# Patient Record
Sex: Female | Born: 1960 | Race: White | Hispanic: No | Marital: Married | Smoking: Never smoker
Health system: Southern US, Community
[De-identification: ages and names within clinical notes are randomized; demographics above are authoritative.]

## PROBLEM LIST (undated history)

## (undated) HISTORY — PX: TONSILLECTOMY: SUR1361

---

## 2017-06-24 DIAGNOSIS — K317 Polyp of stomach and duodenum: Secondary | ICD-10-CM | POA: Insufficient documentation

## 2017-06-24 DIAGNOSIS — K219 Gastro-esophageal reflux disease without esophagitis: Secondary | ICD-10-CM | POA: Insufficient documentation

## 2017-06-24 DIAGNOSIS — Z86010 Personal history of colonic polyps: Secondary | ICD-10-CM | POA: Insufficient documentation

## 2017-07-12 DIAGNOSIS — M174 Other bilateral secondary osteoarthritis of knee: Secondary | ICD-10-CM | POA: Insufficient documentation

## 2017-07-12 DIAGNOSIS — Z6841 Body Mass Index (BMI) 40.0 and over, adult: Secondary | ICD-10-CM | POA: Insufficient documentation

## 2017-09-05 ENCOUNTER — Other Ambulatory Visit: Payer: Self-pay | Admitting: Medical Oncology

## 2017-09-05 DIAGNOSIS — Z1231 Encounter for screening mammogram for malignant neoplasm of breast: Secondary | ICD-10-CM

## 2017-10-01 ENCOUNTER — Ambulatory Visit
Admission: RE | Admit: 2017-10-01 | Discharge: 2017-10-01 | Disposition: A | Discharge status: Home / Self Care | Source: Ambulatory Visit | Attending: Medical Oncology | Admitting: Medical Oncology

## 2017-10-01 ENCOUNTER — Encounter: Payer: Self-pay | Admitting: Radiology

## 2017-10-01 DIAGNOSIS — Z1231 Encounter for screening mammogram for malignant neoplasm of breast: Secondary | ICD-10-CM | POA: Diagnosis not present

## 2018-08-04 ENCOUNTER — Other Ambulatory Visit: Payer: Self-pay | Admitting: Medical Oncology

## 2018-08-04 ENCOUNTER — Ambulatory Visit
Admission: RE | Admit: 2018-08-04 | Discharge: 2018-08-04 | Disposition: A | Discharge status: Home / Self Care | Source: Ambulatory Visit | Attending: Medical Oncology | Admitting: Medical Oncology

## 2018-08-04 DIAGNOSIS — I8312 Varicose veins of left lower extremity with inflammation: Secondary | ICD-10-CM | POA: Diagnosis not present

## 2018-08-04 DIAGNOSIS — I83893 Varicose veins of bilateral lower extremities with other complications: Secondary | ICD-10-CM

## 2018-08-04 DIAGNOSIS — M79605 Pain in left leg: Secondary | ICD-10-CM

## 2018-08-04 DIAGNOSIS — R936 Abnormal findings on diagnostic imaging of limbs: Secondary | ICD-10-CM | POA: Diagnosis not present

## 2018-08-18 ENCOUNTER — Encounter (INDEPENDENT_AMBULATORY_CARE_PROVIDER_SITE_OTHER): Payer: Self-pay | Admitting: Vascular Surgery

## 2018-08-18 ENCOUNTER — Other Ambulatory Visit: Payer: Self-pay | Admitting: Medical Oncology

## 2018-08-18 ENCOUNTER — Ambulatory Visit (INDEPENDENT_AMBULATORY_CARE_PROVIDER_SITE_OTHER): Admitting: Vascular Surgery

## 2018-08-18 VITALS — BP 187/82 | HR 74 | Resp 16 | Ht 65.0 in | Wt 284.4 lb

## 2018-08-18 DIAGNOSIS — R600 Localized edema: Secondary | ICD-10-CM

## 2018-08-18 DIAGNOSIS — M79605 Pain in left leg: Secondary | ICD-10-CM | POA: Diagnosis not present

## 2018-08-18 DIAGNOSIS — E785 Hyperlipidemia, unspecified: Secondary | ICD-10-CM | POA: Diagnosis not present

## 2018-08-18 DIAGNOSIS — M79604 Pain in right leg: Secondary | ICD-10-CM

## 2018-08-18 DIAGNOSIS — Z1231 Encounter for screening mammogram for malignant neoplasm of breast: Secondary | ICD-10-CM

## 2018-08-18 DIAGNOSIS — I10 Essential (primary) hypertension: Secondary | ICD-10-CM

## 2018-08-18 NOTE — Progress Notes (Signed)
Subjective:    Patient ID: Olivia Griffin, female    DOB: 12-06-1960, 57 y.o.   MRN: [id number] Chief Complaint  Patient presents with  . New Patient (Initial Visit)    ref for varicose veins   Presents as a new patient referred by Dr. Basilia Jumbo for evaluation of bilateral lower extremity edema and discomfort.  The patient notes a long-standing history of edema to the bilateral legs.  The patient notes that her edema discomfort worsens towards the end of day or with sitting and standing for long periods of time. The patient's edema is associated with discomfort.  This discomfort worsens with ambulation.  The patient denies any rest pain or ulcer formation to the bilateral lower extremity.  The patient does note that one time in the distant past she did experience weeping from the front of the right calf.  This has not happened again.  At this time, the patient does not engage in conservative therapy including wearing medical grade one compression socks, elevating legs and remaining active.  The patient denies any history of DVT or recent surgery / trauma legs.  The patient denies any recent bouts of recurrent sialitis.  Patient denies any fever, nausea vomiting.  Review of Systems  Constitutional: Negative.   HENT: Negative.   Eyes: Negative.   Respiratory: Negative.   Cardiovascular: Positive for leg swelling.       Lower extremity discomfort  Gastrointestinal: Negative.   Endocrine: Negative.   Genitourinary: Negative.   Musculoskeletal: Negative.   Skin: Negative.   Allergic/Immunologic: Negative.   Neurological: Negative.   Hematological: Negative.   Psychiatric/Behavioral: Negative.       Objective:   Physical Exam  Constitutional: She is oriented to person, place, and time. She appears well-developed and well-nourished. No distress.  HENT:  Head: Normocephalic and atraumatic.  Right Ear: External ear normal.  Left Ear: External ear normal.  Eyes: Pupils are equal, round,  and reactive to light. Conjunctivae and EOM are normal.  Neck: Normal range of motion.  Cardiovascular: Normal rate, regular rhythm, normal heart sounds and intact distal pulses.  Pulses:      Radial pulses are 2+ on the right side, and 2+ on the left side.  To palpate pedal pulses due to body habitus and edema however the bilateral feet are warm.  There is good capillary refill.  Pulmonary/Chest: Effort normal and breath sounds normal.  Musculoskeletal: Normal range of motion. She exhibits edema (Moderately edematous nonpitting edema noted bilaterally).  Neurological: She is alert and oriented to person, place, and time.  Skin: Skin is warm and dry. She is not diaphoretic.  Mild stasis dermatitis noted to the bilateral calves.  Looks like the starting of fibrosis bilaterally.  No cellulitis or active ulcerations noted at this time  Psychiatric: She has a normal mood and affect. Her behavior is normal. Judgment and thought content normal.  Vitals reviewed.  BP (!) 187/82 (BP Location: Right Arm)   Pulse 74   Resp 16   Ht 5\' 5"  (1.651 m)   Wt 284 lb 6.4 oz (129 kg)   BMI 47.33 kg/m   No past medical history on file.  Social History   Socioeconomic History  . Marital status: Married    Spouse name: Not on file  . Number of children: Not on file  . Years of education: Not on file  . Highest education level: Not on file  Occupational History  . Not on file  Social Needs  .  Financial resource strain: Not on file  . Food insecurity:    Worry: Not on file    Inability: Not on file  . Transportation needs:    Medical: Not on file    Non-medical: Not on file  Tobacco Use  . Smoking status: Never Smoker  . Smokeless tobacco: Never Used  Substance and Sexual Activity  . Alcohol use: Yes    Frequency: Never    Comment: ocassionally  . Drug use: Never  . Sexual activity: Not on file  Lifestyle  . Physical activity:    Days per week: Not on file    Minutes per session: Not on  file  . Stress: Not on file  Relationships  . Social connections:    Talks on phone: Not on file    Gets together: Not on file    Attends religious service: Not on file    Active member of club or organization: Not on file    Attends meetings of clubs or organizations: Not on file    Relationship status: Not on file  . Intimate partner violence:    Fear of current or ex partner: Not on file    Emotionally abused: Not on file    Physically abused: Not on file    Forced sexual activity: Not on file  Other Topics Concern  . Not on file  Social History Narrative  . Not on file   Past Surgical History:  Procedure Laterality Date  . CESAREAN SECTION    . TONSILLECTOMY     Family History  Problem Relation Age of Onset  . Breast cancer Mother [id number]  . Breast cancer Paternal Aunt        2 pat aunts   Allergies  Allergen Reactions  . Aspirin Other (See Comments)    Tachycardia- maybe due to excedrin though       Assessment & Plan:  Presents as a new patient referred by Dr. Basilia Jumboovington for evaluation of bilateral lower extremity edema and discomfort.  The patient notes a long-standing history of edema to the bilateral legs.  The patient notes that her edema discomfort worsens towards the end of day or with sitting and standing for long periods of time. The patient's edema is associated with discomfort.  This discomfort worsens with ambulation.  The patient denies any rest pain or ulcer formation to the bilateral lower extremity.  The patient does note that one time in the distant past she did experience weeping from the front of the right calf.  This has not happened again.  At this time, the patient does not engage in conservative therapy including wearing medical grade one compression socks, elevating legs and remaining active.  The patient denies any history of DVT or recent surgery / trauma legs.  The patient denies any recent bouts of recurrent sialitis.  Patient denies any fever, nausea  vomiting.  1. Bilateral lower extremity edema - New The patient presents today with significant edema to the bilateral lower extremity In an attempt to gain control the patient's edema I recommend undergoing 3 layer zinc oxide Unna wraps to the bilateral lower extremity for approximately 1 month changed weekly. When the patient returns in follow-up in 1 month she will undergo an ABI and bilateral lower extremity venous duplex. At that time I will also assess the patient's edema.  If controlled we can transition to compression socks if not we may have to continue Unna wraps The patient was encouraged to elevate her legs  heart level or higher as much as possible I will bring the patient back and have her undergo a bilateral ABI due to the bilateral calf pain she experiences with ambulation.  The patient does have multiple risk factors for peripheral artery disease I will also bring the patient back and have her undergo bilateral lower extremity venous duplex to rule out any venous versus lymphatic disease.  - VAS [place] LOWER EXTREMITY VENOUS REFLUX; Future  2. Lower extremity pain, bilateral - New As above  - VAS [place] ABI WITH/WO TBI; Future  3. Hyperlipidemia, unspecified hyperlipidemia type - Stable Encouraged good control as its slows the progression of atherosclerotic disease  4. Essential hypertension - Stable Encouraged good control as its slows the progression of atherosclerotic disease  Current Outpatient Medications on File Prior to Visit  Medication Sig Dispense Refill  . atorvastatin (LIPITOR) 20 MG tablet     . diclofenac sodium (VOLTAREN) 1 % GEL     . fluticasone (FLONASE) 50 MCG/ACT nasal spray     . irbesartan-hydrochlorothiazide (AVALIDE) 300-12.5 MG tablet Take by mouth.    . loratadine (CLARITIN) 10 MG tablet Take by mouth.    . naproxen (NAPROSYN) 250 MG tablet Take by mouth.    Marland Kitchen omeprazole (PRILOSEC) 20 MG capsule     . Zoster Vaccine Adjuvanted [facility]) injection  Inject IM once. Repeat second injection at reccommended interval     No current facility-administered medications on file prior to visit.    There are no Patient Instructions on file for this visit. No follow-ups on file.  Danyel Tobey A Doniqua Saxby, PA-C

## 2018-08-25 ENCOUNTER — Ambulatory Visit (INDEPENDENT_AMBULATORY_CARE_PROVIDER_SITE_OTHER): Admitting: Nurse Practitioner

## 2018-08-25 ENCOUNTER — Encounter (INDEPENDENT_AMBULATORY_CARE_PROVIDER_SITE_OTHER): Payer: Self-pay

## 2018-08-25 VITALS — BP 145/87 | HR 88 | Resp 18 | Ht 67.0 in | Wt 285.0 lb

## 2018-08-25 DIAGNOSIS — L97929 Non-pressure chronic ulcer of unspecified part of left lower leg with unspecified severity: Secondary | ICD-10-CM

## 2018-08-25 DIAGNOSIS — L97919 Non-pressure chronic ulcer of unspecified part of right lower leg with unspecified severity: Secondary | ICD-10-CM | POA: Diagnosis not present

## 2018-08-25 DIAGNOSIS — I83019 Varicose veins of right lower extremity with ulcer of unspecified site: Secondary | ICD-10-CM

## 2018-08-25 DIAGNOSIS — R600 Localized edema: Secondary | ICD-10-CM

## 2018-08-25 DIAGNOSIS — I83029 Varicose veins of left lower extremity with ulcer of unspecified site: Secondary | ICD-10-CM | POA: Diagnosis not present

## 2018-08-25 NOTE — Progress Notes (Signed)
History of Present Illness  There is no documented history at this time  Assessments & Plan   There are no diagnoses linked to this encounter.    Additional instructions  Subjective:  Patient presents with venous ulcer of the Bilateral lower extremity.    Procedure:  3 layer unna wrap was placed Bilateral lower extremity.   Plan:   Follow up in one week.  

## 2018-08-26 ENCOUNTER — Encounter (INDEPENDENT_AMBULATORY_CARE_PROVIDER_SITE_OTHER): Payer: Self-pay | Admitting: Nurse Practitioner

## 2018-08-27 ENCOUNTER — Other Ambulatory Visit (INDEPENDENT_AMBULATORY_CARE_PROVIDER_SITE_OTHER): Payer: Self-pay | Admitting: Nurse Practitioner

## 2018-08-27 DIAGNOSIS — L97909 Non-pressure chronic ulcer of unspecified part of unspecified lower leg with unspecified severity: Secondary | ICD-10-CM

## 2018-08-27 DIAGNOSIS — I83009 Varicose veins of unspecified lower extremity with ulcer of unspecified site: Secondary | ICD-10-CM | POA: Insufficient documentation

## 2018-09-01 ENCOUNTER — Encounter (INDEPENDENT_AMBULATORY_CARE_PROVIDER_SITE_OTHER)

## 2018-09-03 ENCOUNTER — Ambulatory Visit (INDEPENDENT_AMBULATORY_CARE_PROVIDER_SITE_OTHER): Admitting: Nurse Practitioner

## 2018-09-03 ENCOUNTER — Encounter (INDEPENDENT_AMBULATORY_CARE_PROVIDER_SITE_OTHER): Payer: Self-pay

## 2018-09-03 VITALS — BP 174/94 | HR 69 | Resp 16 | Ht 67.0 in | Wt 285.8 lb

## 2018-09-03 DIAGNOSIS — I83019 Varicose veins of right lower extremity with ulcer of unspecified site: Secondary | ICD-10-CM | POA: Diagnosis not present

## 2018-09-03 DIAGNOSIS — I83029 Varicose veins of left lower extremity with ulcer of unspecified site: Secondary | ICD-10-CM | POA: Diagnosis not present

## 2018-09-03 DIAGNOSIS — L97919 Non-pressure chronic ulcer of unspecified part of right lower leg with unspecified severity: Secondary | ICD-10-CM

## 2018-09-03 DIAGNOSIS — L97929 Non-pressure chronic ulcer of unspecified part of left lower leg with unspecified severity: Secondary | ICD-10-CM | POA: Diagnosis not present

## 2018-09-03 NOTE — Progress Notes (Signed)
History of Present Illness  There is no documented history at this time  Assessments & Plan   There are no diagnoses linked to this encounter.    Additional instructions  Subjective:  Patient presents with venous ulcer of the Bilateral lower extremity.    Procedure:  3 layer unna wrap was placed Bilateral lower extremity.   Plan:   Follow up in one week.  

## 2018-09-04 ENCOUNTER — Encounter (INDEPENDENT_AMBULATORY_CARE_PROVIDER_SITE_OTHER): Payer: Self-pay | Admitting: Nurse Practitioner

## 2018-09-08 ENCOUNTER — Ambulatory Visit (INDEPENDENT_AMBULATORY_CARE_PROVIDER_SITE_OTHER): Admitting: Nurse Practitioner

## 2018-09-08 ENCOUNTER — Encounter (INDEPENDENT_AMBULATORY_CARE_PROVIDER_SITE_OTHER): Payer: Self-pay

## 2018-09-08 VITALS — BP 140/77 | HR 75 | Resp 16

## 2018-09-08 DIAGNOSIS — I83029 Varicose veins of left lower extremity with ulcer of unspecified site: Secondary | ICD-10-CM

## 2018-09-08 DIAGNOSIS — L97929 Non-pressure chronic ulcer of unspecified part of left lower leg with unspecified severity: Secondary | ICD-10-CM

## 2018-09-08 DIAGNOSIS — L97919 Non-pressure chronic ulcer of unspecified part of right lower leg with unspecified severity: Secondary | ICD-10-CM

## 2018-09-08 DIAGNOSIS — I83019 Varicose veins of right lower extremity with ulcer of unspecified site: Secondary | ICD-10-CM

## 2018-09-08 DIAGNOSIS — R600 Localized edema: Secondary | ICD-10-CM

## 2018-09-08 NOTE — Progress Notes (Signed)
History of Present Illness  There is no documented history at this time  Assessments & Plan   There are no diagnoses linked to this encounter.    Additional instructions  Subjective:  Patient presents with venous ulcer of the Bilateral lower extremity.    Procedure:  3 layer unna wrap was placed Bilateral lower extremity.   Plan:   Follow up in one week.  

## 2018-09-15 ENCOUNTER — Encounter (INDEPENDENT_AMBULATORY_CARE_PROVIDER_SITE_OTHER): Payer: Self-pay

## 2018-09-15 ENCOUNTER — Encounter (INDEPENDENT_AMBULATORY_CARE_PROVIDER_SITE_OTHER)

## 2018-09-15 ENCOUNTER — Ambulatory Visit (INDEPENDENT_AMBULATORY_CARE_PROVIDER_SITE_OTHER): Admitting: Vascular Surgery

## 2018-09-18 ENCOUNTER — Encounter (INDEPENDENT_AMBULATORY_CARE_PROVIDER_SITE_OTHER): Payer: Self-pay | Admitting: Vascular Surgery

## 2018-09-18 ENCOUNTER — Ambulatory Visit (INDEPENDENT_AMBULATORY_CARE_PROVIDER_SITE_OTHER)

## 2018-09-18 ENCOUNTER — Ambulatory Visit (INDEPENDENT_AMBULATORY_CARE_PROVIDER_SITE_OTHER): Admitting: Vascular Surgery

## 2018-09-18 VITALS — BP 158/75 | HR 80 | Resp 17 | Ht 67.0 in | Wt 285.0 lb

## 2018-09-18 DIAGNOSIS — R600 Localized edema: Secondary | ICD-10-CM | POA: Diagnosis not present

## 2018-09-18 DIAGNOSIS — I872 Venous insufficiency (chronic) (peripheral): Secondary | ICD-10-CM | POA: Diagnosis not present

## 2018-09-18 DIAGNOSIS — M79604 Pain in right leg: Secondary | ICD-10-CM | POA: Diagnosis not present

## 2018-09-18 DIAGNOSIS — M79605 Pain in left leg: Secondary | ICD-10-CM | POA: Diagnosis not present

## 2018-09-18 DIAGNOSIS — I10 Essential (primary) hypertension: Secondary | ICD-10-CM | POA: Diagnosis not present

## 2018-09-18 DIAGNOSIS — E785 Hyperlipidemia, unspecified: Secondary | ICD-10-CM | POA: Diagnosis not present

## 2018-09-21 ENCOUNTER — Encounter (INDEPENDENT_AMBULATORY_CARE_PROVIDER_SITE_OTHER): Payer: Self-pay | Admitting: Vascular Surgery

## 2018-09-21 DIAGNOSIS — I872 Venous insufficiency (chronic) (peripheral): Secondary | ICD-10-CM | POA: Insufficient documentation

## 2018-09-21 NOTE — Progress Notes (Signed)
MRN : [id number]  Olivia Griffin is a 57 y.o. (31-Oct-1961) female who presents with chief complaint of  Chief Complaint  Patient presents with  . Follow-up    ultrasound follow up  .  History of Present Illness:   The patient returns to the office for followup evaluation regarding leg swelling.  The swelling has persisted and the pain associated with swelling continues. There have not been any interval development of a ulcerations or wounds.  Since the previous visit the patient has been wearing graduated compression stockings and has noted little if any improvement in the lymphedema. The patient has been using compression routinely morning until night.  The patient also states elevation during the day and exercise is being done too.   Current Meds  Medication Sig  . atorvastatin (LIPITOR) 20 MG tablet   . diclofenac sodium (VOLTAREN) 1 % GEL   . fluticasone (FLONASE) 50 MCG/ACT nasal spray   . irbesartan-hydrochlorothiazide (AVALIDE) 300-12.5 MG tablet Take by mouth.  . loratadine (CLARITIN) 10 MG tablet Take by mouth.  . naproxen (NAPROSYN) 250 MG tablet Take by mouth.  Marland Kitchen omeprazole (PRILOSEC) 20 MG capsule   . Zoster Vaccine Adjuvanted [facility]) injection Inject IM once. Repeat second injection at reccommended interval    History reviewed. No pertinent past medical history.  Past Surgical History:  Procedure Laterality Date  . CESAREAN SECTION    . TONSILLECTOMY      Social History Social History   Tobacco Use  . Smoking status: Never Smoker  . Smokeless tobacco: Never Used  Substance Use Topics  . Alcohol use: Yes    Frequency: Never    Comment: ocassionally  . Drug use: Never    Family History Family History  Problem Relation Age of Onset  . Breast cancer Mother 6  . Breast cancer Paternal Aunt        2 pat aunts    Allergies  Allergen Reactions  . Aspirin Other (See Comments)    Tachycardia- maybe due to excedrin though   . Zinc Oxide Rash       REVIEW OF SYSTEMS (Negative unless checked)  Constitutional: [] Weight loss  [] Fever  [] Chills Cardiac: [] Chest pain   [] Chest pressure   [] Palpitations   [] Shortness of breath when laying flat   [] Shortness of breath with exertion. Vascular:  [] Pain in legs with walking   [x] Pain in legs at rest  [] History of DVT   [] Phlebitis   [x] Swelling in legs   [] Varicose veins   [] Non-healing ulcers Pulmonary:   [] Uses home oxygen   [] Productive cough   [] Hemoptysis   [] Wheeze  [] COPD   [] Asthma Neurologic:  [] Dizziness   [] Seizures   [] History of stroke   [] History of TIA  [] Aphasia   [] Vissual changes   [] Weakness or numbness in arm   [] Weakness or numbness in leg Musculoskeletal:   [] Joint swelling   [] Joint pain   [] Low back pain Hematologic:  [] Easy bruising  [] Easy bleeding   [] Hypercoagulable state   [] Anemic Gastrointestinal:  [] Diarrhea   [] Vomiting  [] Gastroesophageal reflux/heartburn   [] Difficulty swallowing. Genitourinary:  [] Chronic kidney disease   [] Difficult urination  [] Frequent urination   [] Blood in urine Skin:  [] Rashes   [] Ulcers  Psychological:  [] History of anxiety   []  History of major depression.  Physical Examination  Vitals:   09/18/18 1401  BP: (!) 158/75  Pulse: 80  Resp: 17  Weight: 285 lb (129.3 kg)  Height: 5\' 7"  (1.702 m)  Body mass index is 44.64 kg/m. Gen: WD/WN, NAD Head: [place]/AT, No temporalis wasting.  Ear/Nose/Throat: Hearing grossly intact, nares w/o erythema or drainage Eyes: PER, EOMI, sclera nonicteric.  Neck: Supple, no large masses.   Pulmonary:  Good air movement, no audible wheezing bilaterally, no use of accessory muscles.  Cardiac: RRR, no JVD Vascular: scattered varicosities present bilaterally.  Mild venous stasis changes to the legs bilaterally.  2-3+ soft pitting edema Vessel Right Left  Radial Palpable Palpable  PT Palpable Palpable  DP Palpable Palpable  Gastrointestinal: Non-distended. No guarding/no peritoneal signs.   Musculoskeletal: M/S 5/5 throughout.  No deformity or atrophy.  Neurologic: CN 2-12 intact. Symmetrical.  Speech is fluent. Motor exam as listed above. Psychiatric: Judgment intact, Mood & affect appropriate for pt's clinical situation. Dermatologic: No rashes or ulcers noted.  No changes consistent with cellulitis. Lymph : No lichenification or skin changes of chronic lymphedema.  CBC No results found for: WBC, HGB, HCT, MCV, PLT  BMET No results found for: NA, K, CL, CO2, GLUCOSE, BUN, CREATININE, CALCIUM, GFRNONAA, GFRAA CrCl cannot be calculated (No successful lab value found.).  COAG No results found for: INR, PROTIME  Radiology No results found.   Assessment/Plan 1. Bilateral lower extremity edema Recommend:  No surgery or intervention at this point in time.    I have reviewed my previous discussion with the patient regarding swelling and why it causes symptoms.  Patient will continue wearing graduated compression stockings class 1 (20-30 mmHg) on a daily basis. The patient will  beginning wearing the stockings first thing in the morning and removing them in the evening. The patient is instructed specifically not to sleep in the stockings.    In addition, behavioral modification including several periods of elevation of the lower extremities during the day will be continued.  This was reviewed with the patient during the initial visit.  The patient will also continue routine exercise, especially walking on a daily basis as was discussed during the initial visit.    Despite conservative treatments including graduated compression therapy class 1 and behavioral modification including exercise and elevation the patient  has not obtained adequate control of the lymphedema.  The patient still has stage 3 lymphedema and therefore, I believe that a lymph pump should be added to improve the control of the patient's lymphedema.  Additionally, a lymph pump is warranted because it will  reduce the risk of cellulitis and ulceration in the future.  Patient should follow-up in six months    2. Chronic venous insufficiency Recommend:  No surgery or intervention at this point in time.    I have reviewed my previous discussion with the patient regarding swelling and why it causes symptoms.  Patient will continue wearing graduated compression stockings class 1 (20-30 mmHg) on a daily basis. The patient will  beginning wearing the stockings first thing in the morning and removing them in the evening. The patient is instructed specifically not to sleep in the stockings.    In addition, behavioral modification including several periods of elevation of the lower extremities during the day will be continued.  This was reviewed with the patient during the initial visit.  The patient will also continue routine exercise, especially walking on a daily basis as was discussed during the initial visit.    Despite conservative treatments including graduated compression therapy class 1 and behavioral modification including exercise and elevation the patient  has not obtained adequate control of the lymphedema.  The patient still has  stage 3 lymphedema and therefore, I believe that a lymph pump should be added to improve the control of the patient's lymphedema.  Additionally, a lymph pump is warranted because it will reduce the risk of cellulitis and ulceration in the future.  Patient should follow-up in six months    3. Essential hypertension Continue antihypertensive medications as already ordered, these medications have been reviewed and there are no changes at this time.   4. Hyperlipidemia, unspecified hyperlipidemia type Continue statin as ordered and reviewed, no changes at this time     Levora Dredge, MD  09/21/2018 10:27 AM

## 2018-10-03 DIAGNOSIS — R7309 Other abnormal glucose: Secondary | ICD-10-CM | POA: Insufficient documentation

## 2018-10-06 ENCOUNTER — Ambulatory Visit
Admission: RE | Admit: 2018-10-06 | Discharge: 2018-10-06 | Disposition: A | Discharge status: Home / Self Care | Source: Ambulatory Visit | Attending: Medical Oncology | Admitting: Medical Oncology

## 2018-10-06 DIAGNOSIS — Z1231 Encounter for screening mammogram for malignant neoplasm of breast: Secondary | ICD-10-CM | POA: Insufficient documentation

## 2019-03-14 ENCOUNTER — Emergency Department
Admission: EM | Admit: 2019-03-14 | Discharge: 2019-03-14 | Disposition: A | Discharge status: Home / Self Care | Attending: Emergency Medicine | Admitting: Emergency Medicine

## 2019-03-14 ENCOUNTER — Emergency Department

## 2019-03-14 ENCOUNTER — Encounter: Payer: Self-pay | Admitting: Emergency Medicine

## 2019-03-14 ENCOUNTER — Other Ambulatory Visit: Payer: Self-pay

## 2019-03-14 DIAGNOSIS — Y9389 Activity, other specified: Secondary | ICD-10-CM | POA: Insufficient documentation

## 2019-03-14 DIAGNOSIS — S6991XA Unspecified injury of right wrist, hand and finger(s), initial encounter: Secondary | ICD-10-CM | POA: Diagnosis present

## 2019-03-14 DIAGNOSIS — I10 Essential (primary) hypertension: Secondary | ICD-10-CM | POA: Insufficient documentation

## 2019-03-14 DIAGNOSIS — Z23 Encounter for immunization: Secondary | ICD-10-CM | POA: Insufficient documentation

## 2019-03-14 DIAGNOSIS — Z79899 Other long term (current) drug therapy: Secondary | ICD-10-CM | POA: Diagnosis not present

## 2019-03-14 DIAGNOSIS — S61212A Laceration without foreign body of right middle finger without damage to nail, initial encounter: Secondary | ICD-10-CM | POA: Diagnosis not present

## 2019-03-14 DIAGNOSIS — Y929 Unspecified place or not applicable: Secondary | ICD-10-CM | POA: Diagnosis not present

## 2019-03-14 DIAGNOSIS — S61411A Laceration without foreign body of right hand, initial encounter: Secondary | ICD-10-CM | POA: Insufficient documentation

## 2019-03-14 DIAGNOSIS — S62114A Nondisplaced fracture of triquetrum [cuneiform] bone, right wrist, initial encounter for closed fracture: Secondary | ICD-10-CM | POA: Diagnosis not present

## 2019-03-14 DIAGNOSIS — S52571A Other intraarticular fracture of lower end of right radius, initial encounter for closed fracture: Secondary | ICD-10-CM | POA: Insufficient documentation

## 2019-03-14 DIAGNOSIS — Y999 Unspecified external cause status: Secondary | ICD-10-CM | POA: Diagnosis not present

## 2019-03-14 DIAGNOSIS — S20211A Contusion of right front wall of thorax, initial encounter: Secondary | ICD-10-CM | POA: Diagnosis not present

## 2019-03-14 MED ORDER — TETANUS-DIPHTH-ACELL PERTUSSIS 5-2.5-18.5 LF-MCG/0.5 IM SUSP
0.5000 mL | Freq: Once | INTRAMUSCULAR | Status: AC
  Administered 2019-03-14: 0.5 mL via INTRAMUSCULAR
  Filled 2019-03-14: qty 0.5

## 2019-03-14 MED ORDER — ACETAMINOPHEN 500 MG PO TABS
1000.0000 mg | ORAL_TABLET | Freq: Once | ORAL | Status: DC
  Filled 2019-03-14: qty 2

## 2019-03-14 MED ORDER — LIDOCAINE HCL (PF) 1 % IJ SOLN
10.0000 mL | Freq: Once | INTRAMUSCULAR | Status: AC
  Administered 2019-03-14: 10 mL
  Filled 2019-03-14: qty 10

## 2019-03-14 MED ORDER — CEPHALEXIN 500 MG PO CAPS: 1000.0000 mg | ORAL_CAPSULE | Freq: Two times a day (BID) | ORAL | 0 refills | Status: DC

## 2019-03-14 MED ORDER — ONDANSETRON 4 MG PO TBDP
4.0000 mg | ORAL_TABLET | Freq: Once | ORAL | Status: AC
  Administered 2019-03-14: 16:00:00 4 mg via ORAL
  Filled 2019-03-14: qty 1

## 2019-03-14 MED ORDER — OXYCODONE-ACETAMINOPHEN 5-325 MG PO TABS
1.0000 | ORAL_TABLET | Freq: Once | ORAL | Status: AC
  Administered 2019-03-14: 1 via ORAL
  Filled 2019-03-14: qty 1

## 2019-03-14 MED ORDER — OXYCODONE-ACETAMINOPHEN 5-325 MG PO TABS: 1.0000 | ORAL_TABLET | Freq: Four times a day (QID) | ORAL | 0 refills | Status: DC | PRN
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
## 2019-03-23 ENCOUNTER — Ambulatory Visit (INDEPENDENT_AMBULATORY_CARE_PROVIDER_SITE_OTHER): Admitting: Vascular Surgery

## 2019-04-09 ENCOUNTER — Ambulatory Visit (INDEPENDENT_AMBULATORY_CARE_PROVIDER_SITE_OTHER): Admitting: Vascular Surgery

## 2019-04-23 ENCOUNTER — Ambulatory Visit (INDEPENDENT_AMBULATORY_CARE_PROVIDER_SITE_OTHER): Admitting: Vascular Surgery

## 2019-05-07 ENCOUNTER — Other Ambulatory Visit: Payer: Self-pay | Admitting: Medical Oncology

## 2019-05-07 DIAGNOSIS — N6310 Unspecified lump in the right breast, unspecified quadrant: Secondary | ICD-10-CM

## 2019-05-25 ENCOUNTER — Other Ambulatory Visit: Payer: Self-pay | Admitting: Medical Oncology

## 2019-05-25 DIAGNOSIS — N6310 Unspecified lump in the right breast, unspecified quadrant: Secondary | ICD-10-CM

## 2019-05-27 ENCOUNTER — Encounter (INDEPENDENT_AMBULATORY_CARE_PROVIDER_SITE_OTHER): Payer: Self-pay | Admitting: Vascular Surgery

## 2019-10-15 ENCOUNTER — Other Ambulatory Visit: Payer: Self-pay | Admitting: Medical Oncology

## 2019-10-15 DIAGNOSIS — N6310 Unspecified lump in the right breast, unspecified quadrant: Secondary | ICD-10-CM

## 2020-07-29 DIAGNOSIS — Z01818 Encounter for other preprocedural examination: Secondary | ICD-10-CM | POA: Insufficient documentation

## 2020-08-19 ENCOUNTER — Other Ambulatory Visit: Payer: Self-pay | Admitting: Medical Oncology

## 2020-08-19 DIAGNOSIS — Z1231 Encounter for screening mammogram for malignant neoplasm of breast: Secondary | ICD-10-CM

## 2020-08-31 ENCOUNTER — Ambulatory Visit
Admission: RE | Admit: 2020-08-31 | Discharge: 2020-08-31 | Disposition: A | Discharge status: Home / Self Care | Source: Ambulatory Visit | Attending: Medical Oncology | Admitting: Medical Oncology

## 2020-08-31 ENCOUNTER — Other Ambulatory Visit: Payer: Self-pay

## 2020-08-31 DIAGNOSIS — Z1231 Encounter for screening mammogram for malignant neoplasm of breast: Secondary | ICD-10-CM | POA: Diagnosis not present

## 2020-09-05 ENCOUNTER — Encounter (INDEPENDENT_AMBULATORY_CARE_PROVIDER_SITE_OTHER): Payer: Self-pay | Admitting: Vascular Surgery

## 2020-09-05 ENCOUNTER — Ambulatory Visit (INDEPENDENT_AMBULATORY_CARE_PROVIDER_SITE_OTHER): Admitting: Vascular Surgery

## 2020-09-05 ENCOUNTER — Other Ambulatory Visit: Payer: Self-pay

## 2020-09-05 VITALS — BP 137/86 | HR 101 | Resp 16 | Wt 273.6 lb

## 2020-09-05 DIAGNOSIS — I10 Essential (primary) hypertension: Secondary | ICD-10-CM | POA: Diagnosis not present

## 2020-09-05 DIAGNOSIS — E785 Hyperlipidemia, unspecified: Secondary | ICD-10-CM | POA: Diagnosis not present

## 2020-09-05 DIAGNOSIS — I872 Venous insufficiency (chronic) (peripheral): Secondary | ICD-10-CM | POA: Diagnosis not present

## 2020-09-05 DIAGNOSIS — I890 Lymphedema, not elsewhere classified: Secondary | ICD-10-CM | POA: Diagnosis not present

## 2020-09-05 DIAGNOSIS — K219 Gastro-esophageal reflux disease without esophagitis: Secondary | ICD-10-CM

## 2020-09-05 NOTE — Progress Notes (Signed)
MRN : [id number]  Olivia Griffin is a 59 y.o. (09-23-1961) female who presents with chief complaint of No chief complaint on file. Marland Kitchen  History of Present Illness:   The patient returns to the office for followup evaluation regarding leg swelling.  The swelling has persisted but with the lymph pump is much, much better controlled. The pain associated with swelling is essentially eliminated. There have not been any interval development of a ulcerations or wounds.  The patient denies problems with the pump, noting it is working well and the leggings are in good condition.  Since the previous visit the patient has been wearing graduated compression stockings and using the lymph pump on a routine basis and  has noted significant improvement in the lymphedema.   Patient stated the lymph pump has been a very positive factor in her care.    Current Meds  Medication Sig  . acetaminophen (TYLENOL) 325 MG tablet Take by mouth.  Marland Kitchen atorvastatin (LIPITOR) 20 MG tablet   . Blood Glucose Monitoring Suppl (FIFTY50 GLUCOSE METER 2.0) w/Device KIT See admin instructions.  . diclofenac sodium (VOLTAREN) 1 % GEL   . ergocalciferol (VITAMIN D2) 1.25 MG (50000 UT) capsule Take by mouth.  . fluticasone (FLONASE) 50 MCG/ACT nasal spray   . glipiZIDE (GLUCOTROL XL) 5 MG 24 hr tablet Take 1 tablet by mouth daily.  Marland Kitchen glucose blood (PRECISION QID TEST) test strip Use to check fasting blood sugars once daily or if you feel that your sugar is too high/low. E11.9  . irbesartan-hydrochlorothiazide (AVALIDE) 300-12.5 MG tablet Take by mouth.  . Lancets (FREESTYLE) lancets   . loratadine (CLARITIN) 10 MG tablet Take by mouth.  . naproxen (NAPROSYN) 250 MG tablet Take by mouth.  Marland Kitchen omeprazole (PRILOSEC) 20 MG capsule   . topiramate (TOPAMAX) 100 MG tablet Take by mouth 2 (two) times daily.     No past medical history on file.  Past Surgical History:  Procedure Laterality Date  . CESAREAN SECTION    .  TONSILLECTOMY      Social History Social History   Tobacco Use  . Smoking status: Never Smoker  . Smokeless tobacco: Never Used  Substance Use Topics  . Alcohol use: Yes    Comment: ocassionally  . Drug use: Never    Family History Family History  Problem Relation Age of Onset  . Breast cancer Mother 35  . Breast cancer Paternal Aunt        2 pat aunts    Allergies  Allergen Reactions  . Aspirin Other (See Comments)    Tachycardia- maybe due to excedrin though   . Metformin Diarrhea  . Zinc Oxide Rash     REVIEW OF SYSTEMS (Negative unless checked)  Constitutional: $RemoveBeforeDE'[]'pIbeMmVaTdqLYbM$ Weight loss  $Rem'[]'qaqs$ Fever  $Remo'[]'WUTkl$ Chills Cardiac: $RemoveBeforeD'[]'TMxRPZzbsMnPwL$ Chest pain   '[]'$ Chest pressure   '[]'$ Palpitations   '[]'$ Shortness of breath when laying flat   '[]'$ Shortness of breath with exertion. Vascular:  $RemoveBe'[]'flsfziLCb$ Pain in legs with walking   '[]'$ Pain in legs at rest  $Rem'[]'BLVA$ History of DVT   '[]'$ Phlebitis   '[x]'$ Swelling in legs   '[]'$ Varicose veins   '[]'$ Non-healing ulcers Pulmonary:   '[]'$ Uses home oxygen   '[]'$ Productive cough   '[]'$ Hemoptysis   '[]'$ Wheeze  $Remov'[]'VMvGfx$ COPD   '[]'$ Asthma Neurologic:  $RemoveBefo'[]'zfCbHazzJGe$ Dizziness   '[]'$ Seizures   '[]'$ History of stroke   '[]'$ History of TIA  $Re'[]'Idk$ Aphasia   '[]'$ Vissual changes   '[]'$ Weakness or numbness in arm   '[]'$ Weakness or numbness in leg Musculoskeletal:   '[]'$ Joint swelling   '[]'$   Joint pain   [] Low back pain Hematologic:  [] Easy bruising  [] Easy bleeding   [] Hypercoagulable state   [] Anemic Gastrointestinal:  [] Diarrhea   [] Vomiting  [x] Gastroesophageal reflux/heartburn   [] Difficulty swallowing. Genitourinary:  [] Chronic kidney disease   [] Difficult urination  [] Frequent urination   [] Blood in urine Skin:  [] Rashes   [] Ulcers  Psychological:  [] History of anxiety   []  History of major depression.  Physical Examination  Vitals:   09/05/20 1131  BP: 137/86  Pulse: (!) 101  Resp: 16  Weight: 273 lb 9.6 oz (124.1 kg)   Body mass index is 45.53 kg/m. Gen: WD/WN, NAD Head: [place]/AT, No temporalis wasting.  Ear/Nose/Throat: Hearing grossly intact,  nares w/o erythema or drainage Eyes: PER, EOMI, sclera nonicteric.  Neck: Supple, no large masses.   Pulmonary:  Good air movement, no audible wheezing bilaterally, no use of accessory muscles.  Cardiac: RRR, no JVD Vascular: scattered varicosities present bilaterally.  Mild venous stasis changes to the legs bilaterally.  3-4+ soft pitting edema Vessel Right Left  Radial Palpable Palpable  Gastrointestinal: Non-distended. No guarding/no peritoneal signs.  Musculoskeletal: M/S 5/5 throughout.  No deformity or atrophy.  Neurologic: CN 2-12 intact. Symmetrical.  Speech is fluent. Motor exam as listed above. Psychiatric: Judgment intact, Mood & affect appropriate for pt's clinical situation. Dermatologic: Mild venous rashes or ulcers noted.  No changes consistent with cellulitis. Lymph : Mild lichenification with skin changes of chronic lymphedema.  CBC No results found for: WBC, HGB, HCT, MCV, PLT  BMET No results found for: NA, K, CL, CO2, GLUCOSE, BUN, CREATININE, CALCIUM, GFRNONAA, GFRAA CrCl cannot be calculated (No successful lab value found.).  COAG No results found for: INR, PROTIME  Radiology MM 3D SCREEN BREAST BILATERAL  Result Date: 09/01/2020 CLINICAL DATA:  Screening. EXAM: DIGITAL SCREENING BILATERAL MAMMOGRAM WITH TOMO AND CAD COMPARISON:  Previous exam(s). ACR Breast Density Category b: There are scattered areas of fibroglandular density. FINDINGS: There are no findings suspicious for malignancy. Images were processed with CAD. IMPRESSION: No mammographic evidence of malignancy. A result letter of this screening mammogram will be mailed directly to the patient. RECOMMENDATION: Screening mammogram in one year. (Code:SM-B-01Y) BI-RADS CATEGORY  1: Negative. Electronically Signed   By: Dorise Bullion III M.D   On: 09/01/2020 16:13      Assessment/Plan 1. Lymphedema  No surgery or intervention at this point in time.    I have reviewed my discussion with the patient  regarding lymphedema and why it  causes symptoms.  Patient will continue wearing graduated compression stockings class 1 (20-30 mmHg) on a daily basis a prescription was given. The patient is reminded to put the stockings on first thing in the morning and removing them in the evening. The patient is instructed specifically not to sleep in the stockings.   In addition, behavioral modification throughout the day will be continued.  This will include frequent elevation (such as in a recliner), use of over the counter pain medications as needed and exercise such as walking.  I have reviewed systemic causes for chronic edema such as liver, kidney and cardiac etiologies and there does not appear to be any significant changes in these organ systems over the past year.  The patient is under the impression that these organ systems are all stable and unchanged.    The patient will continue aggressive use of the  lymph pump.  This will continue to improve the edema control and prevent sequela such as ulcers and infections.  The patient will follow-up with me on an annual basis.    2. Chronic venous insufficiency  No surgery or intervention at this point in time.    I have reviewed my discussion with the patient regarding lymphedema and why it  causes symptoms.  Patient will continue wearing graduated compression stockings class 1 (20-30 mmHg) on a daily basis a prescription was given. The patient is reminded to put the stockings on first thing in the morning and removing them in the evening. The patient is instructed specifically not to sleep in the stockings.   In addition, behavioral modification throughout the day will be continued.  This will include frequent elevation (such as in a recliner), use of over the counter pain medications as needed and exercise such as walking.  I have reviewed systemic causes for chronic edema such as liver, kidney and cardiac etiologies and there does not appear to be any  significant changes in these organ systems over the past year.  The patient is under the impression that these organ systems are all stable and unchanged.    The patient will continue aggressive use of the  lymph pump.  This will continue to improve the edema control and prevent sequela such as ulcers and infections.   The patient will follow-up with me on an annual basis.    3. Essential hypertension Continue antihypertensive medications as already ordered, these medications have been reviewed and there are no changes at this time.   4. Hyperlipidemia, unspecified hyperlipidemia type Continue statin as ordered and reviewed, no changes at this time   5. Gastroesophageal reflux disease without esophagitis Continue PPI as already ordered, this medication has been reviewed and there are no changes at this time.  Avoidence of caffeine and alcohol  Moderate elevation of the head of the bed    Hortencia Pilar, MD  09/05/2020 11:40 AM

## 2021-08-31 ENCOUNTER — Other Ambulatory Visit: Payer: Self-pay

## 2021-08-31 DIAGNOSIS — Z1231 Encounter for screening mammogram for malignant neoplasm of breast: Secondary | ICD-10-CM

## 2021-09-04 ENCOUNTER — Ambulatory Visit (INDEPENDENT_AMBULATORY_CARE_PROVIDER_SITE_OTHER): Payer: Self-pay | Admitting: Vascular Surgery

## 2021-09-04 ENCOUNTER — Other Ambulatory Visit: Payer: Self-pay

## 2021-09-04 VITALS — BP 168/100 | HR 74 | Ht 64.0 in | Wt 281.0 lb

## 2021-09-04 DIAGNOSIS — I10 Essential (primary) hypertension: Secondary | ICD-10-CM

## 2021-09-04 DIAGNOSIS — K219 Gastro-esophageal reflux disease without esophagitis: Secondary | ICD-10-CM

## 2021-09-04 DIAGNOSIS — I872 Venous insufficiency (chronic) (peripheral): Secondary | ICD-10-CM

## 2021-09-04 DIAGNOSIS — I890 Lymphedema, not elsewhere classified: Secondary | ICD-10-CM

## 2021-09-04 DIAGNOSIS — E785 Hyperlipidemia, unspecified: Secondary | ICD-10-CM

## 2021-09-05 ENCOUNTER — Ambulatory Visit
Admission: RE | Admit: 2021-09-05 | Discharge: 2021-09-05 | Disposition: A | Discharge status: Home / Self Care | Source: Ambulatory Visit

## 2021-09-05 DIAGNOSIS — Z1231 Encounter for screening mammogram for malignant neoplasm of breast: Secondary | ICD-10-CM | POA: Diagnosis not present

## 2021-09-12 ENCOUNTER — Encounter (INDEPENDENT_AMBULATORY_CARE_PROVIDER_SITE_OTHER): Payer: Self-pay | Admitting: Vascular Surgery

## 2021-09-12 NOTE — Progress Notes (Signed)
MRN : [id number]  Olivia Griffin is a 60 y.o. (Sep 16, 1961) female who presents with chief complaint of leg swelling.  History of Present Illness:   The patient returns to the office for followup evaluation regarding leg swelling.  The swelling has improved quite a bit and the pain associated with swelling has decreased substantially. There have not been any interval development of a ulcerations or wounds.  Since the previous visit the patient has been wearing graduated compression stockings and has noted little significant improvement in the lymphedema. The patient has been using compression routinely morning until night.  The patient also states elevation during the day and exercise is being done too.     Current Meds  Medication Sig   acetaminophen (TYLENOL) 325 MG tablet Take by mouth.   atorvastatin (LIPITOR) 20 MG tablet    Blood Glucose Monitoring Suppl (FIFTY50 GLUCOSE METER 2.0) w/Device KIT See admin instructions.   cephALEXin (KEFLEX) 500 MG capsule Take 2 capsules (1,000 mg total) by mouth 2 (two) times daily.   diclofenac sodium (VOLTAREN) 1 % GEL    fluticasone (FLONASE) 50 MCG/ACT nasal spray    glipiZIDE (GLUCOTROL XL) 5 MG 24 hr tablet Take 1 tablet by mouth daily.   glucose blood (PRECISION QID TEST) test strip Use to check fasting blood sugars once daily or if you feel that your sugar is too high/low. E11.9   Lancets (FREESTYLE) lancets    naproxen (NAPROSYN) 250 MG tablet Take by mouth.   omeprazole (PRILOSEC) 20 MG capsule    oxyCODONE-acetaminophen (PERCOCET/ROXICET) 5-325 MG tablet Take 1 tablet by mouth every 6 (six) hours as needed for severe pain.   Zoster Vaccine Adjuvanted [facility]) injection Inject IM once. Repeat second injection at reccommended interval    No past medical history on file.  Past Surgical History:  Procedure Laterality Date   CESAREAN SECTION     TONSILLECTOMY      Social History Social History   Tobacco Use   Smoking  status: Never   Smokeless tobacco: Never  Substance Use Topics   Alcohol use: Yes    Comment: ocassionally   Drug use: Never    Family History Family History  Problem Relation Age of Onset   Breast cancer Mother 42   Breast cancer Paternal Aunt        2 pat aunts    Allergies  Allergen Reactions   Aspirin Other (See Comments)    Tachycardia- maybe due to excedrin though    Metformin Diarrhea   Zinc Oxide Rash     REVIEW OF SYSTEMS (Negative unless checked)  Constitutional: _0 Weight loss  _1 Fever  _2 Chills Cardiac: _3 Chest pain   _4 Chest pressure   _5 Palpitations   _6 Shortness of breath when laying flat   _7 Shortness of breath with exertion. Vascular:  _8 Pain in legs with walking   _9 Pain in legs at rest  _10 History of DVT   _11 Phlebitis   _12 Swelling in legs   _13 Varicose veins   _14 Non-healing ulcers Pulmonary:   _15 Uses home oxygen   _16 Productive cough   _17 Hemoptysis   _18 Wheeze  _19 COPD   _20 Asthma Neurologic:  _21 Dizziness   _22 Seizures   _23 History of stroke   _24 History of TIA  _25 Aphasia   _26 Vissual changes   _27 Weakness or numbness in arm   _28 Weakness or numbness in leg Musculoskeletal:   _29 Joint swelling   _30 Joint pain   _31 Low back pain Hematologic:  _32 Easy bruising  _33 Easy bleeding   _34 Hypercoagulable state   _35 Anemic Gastrointestinal:  _36   Diarrhea   _0 Vomiting  _1 Gastroesophageal reflux/heartburn   _2 Difficulty swallowing. Genitourinary:  _3 Chronic kidney disease   _4 Difficult urination  _5 Frequent urination   _6 Blood in urine Skin:  _7 Rashes   _8 Ulcers  Psychological:  _9 History of anxiety   _10  History of major depression.  Physical Examination  Vitals:   09/04/21 1531  BP: (!) 168/100  Pulse: 74  Weight: 281 lb (127.5 kg)  Height: _11  (1.626 m)   Body mass index is 48.23 kg/m. Gen: WD/WN, NAD Head: [place]/AT, No temporalis wasting.  Ear/Nose/Throat: Hearing grossly intact, nares w/o erythema or drainage, pinna without lesions Eyes: PER, EOMI, sclera nonicteric.   Neck: Supple, no gross masses.  No JVD.  Pulmonary:  Good air movement, no audible wheezing, no use of accessory muscles.  Cardiac: RRR, precordium not hyperdynamic. Vascular:  scattered varicosities present bilaterally.  Mild venous stasis changes to the legs bilaterally.  2+ soft pitting edema  Vessel Right Left  Radial Palpable Palpable  Gastrointestinal: soft, non-distended. No guarding/no peritoneal signs.  Musculoskeletal: M/S 5/5 throughout.  No deformity.  Neurologic: CN 2-12 intact. Pain and light touch intact in extremities.  Symmetrical.  Speech is fluent. Motor exam as listed above. Psychiatric: Judgment intact, Mood & affect appropriate for pt's clinical situation. Dermatologic: Venous rashes no ulcers noted.  No changes consistent with cellulitis. Lymph : No lichenification or skin changes of chronic lymphedema.  CBC No results found for: WBC, HGB, HCT, MCV, PLT  BMET No results found for: NA, K, CL, CO2, GLUCOSE, BUN, CREATININE, CALCIUM, GFRNONAA, GFRAA CrCl cannot be calculated (No successful lab value found.).  COAG No results found for: INR, PROTIME  Radiology MM 3D SCREEN BREAST BILATERAL  Result Date: 09/08/2021 CLINICAL DATA:  Screening. EXAM: DIGITAL SCREENING BILATERAL MAMMOGRAM WITH TOMOSYNTHESIS AND CAD TECHNIQUE: Bilateral screening digital craniocaudal and mediolateral oblique mammograms were obtained. Bilateral screening digital breast tomosynthesis was performed. The images were evaluated with computer-aided detection. COMPARISON:  Previous exam(s). ACR Breast Density Category a: The breast tissue is almost entirely fatty. FINDINGS: There are no findings suspicious for malignancy. IMPRESSION: No mammographic evidence of malignancy. A result letter of this screening mammogram will be mailed directly to the patient. RECOMMENDATION: Screening mammogram in one year. (Code:SM-B-01Y) BI-RADS CATEGORY  1: Negative. Electronically Signed   By: Evangeline Dakin M.D.    On: 09/08/2021 11:42    Assessment/Plan 1. Lymphedema No surgery or intervention at this point in time.  I have reviewed my discussion with the patient regarding venous insufficiency and why it causes symptoms. I have discussed with the patient the chronic skin changes that accompany venous insufficiency and the long term sequela such as ulceration. Patient will contnue wearing graduated compression stockings on a daily basis, as this has provided excellent control of his edema. The patient will put the stockings on first thing in the morning and removing them in the evening. The patient is reminded not to sleep in the stockings.  In addition, behavioral modification including elevation during the day will be initiated. Exercise is strongly encouraged.  Given the patient's good control and lack of any problems regarding the venous insufficiency and lymphedema and the lymph pump is working.  The patient will follow up with me PRN should anything change.  The patient voices agreement with this plan.   2. Chronic venous insufficiency No surgery or intervention at this point in time.  I have reviewed my discussion with the patient regarding venous insufficiency and why it causes symptoms. I have discussed  with the patient the chronic skin changes that accompany venous insufficiency and the long term sequela such as ulceration. Patient will contnue wearing graduated compression stockings on a daily basis, as this has provided excellent control of his edema. The patient will put the stockings on first thing in the morning and removing them in the evening. The patient is reminded not to sleep in the stockings.  In addition, behavioral modification including elevation during the day will be initiated. Exercise is strongly encouraged.  Given the patient's good control and lack of any problems regarding the venous insufficiency and lymphedema and the lymph pump is working.  The patient will follow up  with me PRN should anything change.  The patient voices agreement with this plan.   3. Essential hypertension Continue antihypertensive medications as already ordered, these medications have been reviewed and there are no changes at this time.   4. Hyperlipidemia, unspecified hyperlipidemia type Continue statin as ordered and reviewed, no changes at this time   5. Gastroesophageal reflux disease without esophagitis Continue PPI as already ordered, this medication has been reviewed and there are no changes at this time.  Avoidence of caffeine and alcohol  Moderate elevation of the head of the bed      Hortencia Pilar, MD  09/12/2021 4:07 PM

## 2022-02-01 ENCOUNTER — Other Ambulatory Visit: Payer: Self-pay | Admitting: Surgery

## 2022-02-01 DIAGNOSIS — M1711 Unilateral primary osteoarthritis, right knee: Secondary | ICD-10-CM

## 2022-02-07 ENCOUNTER — Ambulatory Visit
Admission: RE | Admit: 2022-02-07 | Discharge: 2022-02-07 | Disposition: A | Discharge status: Home / Self Care | Source: Ambulatory Visit | Attending: Surgery | Admitting: Surgery

## 2022-02-07 ENCOUNTER — Other Ambulatory Visit: Payer: Self-pay

## 2022-02-07 DIAGNOSIS — M1711 Unilateral primary osteoarthritis, right knee: Secondary | ICD-10-CM | POA: Insufficient documentation

## 2022-02-20 ENCOUNTER — Encounter: Payer: Self-pay | Admitting: Cardiology

## 2022-02-20 ENCOUNTER — Other Ambulatory Visit: Payer: Self-pay

## 2022-02-20 ENCOUNTER — Encounter
Admission: RE | Disposition: A | Discharge status: Home / Self Care | Payer: Self-pay | Source: Home / Self Care | Attending: Cardiology

## 2022-02-20 ENCOUNTER — Observation Stay
Admission: RE | Admit: 2022-02-20 | Discharge: 2022-02-21 | Disposition: A | Discharge status: Home / Self Care | Attending: Cardiology | Admitting: Cardiology

## 2022-02-20 DIAGNOSIS — Z794 Long term (current) use of insulin: Secondary | ICD-10-CM | POA: Insufficient documentation

## 2022-02-20 DIAGNOSIS — E119 Type 2 diabetes mellitus without complications: Secondary | ICD-10-CM | POA: Insufficient documentation

## 2022-02-20 DIAGNOSIS — I10 Essential (primary) hypertension: Secondary | ICD-10-CM | POA: Diagnosis not present

## 2022-02-20 DIAGNOSIS — R931 Abnormal findings on diagnostic imaging of heart and coronary circulation: Secondary | ICD-10-CM | POA: Diagnosis present

## 2022-02-20 DIAGNOSIS — Z79899 Other long term (current) drug therapy: Secondary | ICD-10-CM | POA: Insufficient documentation

## 2022-02-20 DIAGNOSIS — I2510 Atherosclerotic heart disease of native coronary artery without angina pectoris: Principal | ICD-10-CM | POA: Diagnosis present

## 2022-02-20 HISTORY — PX: CORONARY STENT INTERVENTION: CATH118234

## 2022-02-20 HISTORY — PX: LEFT HEART CATH AND CORONARY ANGIOGRAPHY: CATH118249

## 2022-02-20 LAB — POCT ACTIVATED CLOTTING TIME
Activated Clotting Time: 251 seconds
Activated Clotting Time: 329 seconds

## 2022-02-20 LAB — GLUCOSE, CAPILLARY
Glucose-Capillary: 123 mg/dL — ABNORMAL HIGH (ref 70–99)
Glucose-Capillary: 100 mg/dL — ABNORMAL HIGH (ref 70–99)
Glucose-Capillary: 146 mg/dL — ABNORMAL HIGH (ref 70–99)
Glucose-Capillary: 105 mg/dL — ABNORMAL HIGH (ref 70–99)

## 2022-02-20 SURGERY — LEFT HEART CATH AND CORONARY ANGIOGRAPHY
Anesthesia: Moderate Sedation

## 2022-02-20 MED ORDER — FENTANYL CITRATE (PF) 100 MCG/2ML IJ SOLN
INTRAMUSCULAR | Status: AC
  Filled 2022-02-20: qty 2

## 2022-02-20 MED ORDER — HEPARIN SODIUM (PORCINE) 1000 UNIT/ML IJ SOLN
INTRAMUSCULAR | Status: AC
  Filled 2022-02-20: qty 10

## 2022-02-20 MED ORDER — TICAGRELOR 90 MG PO TABS
ORAL_TABLET | ORAL | Status: AC
  Filled 2022-02-20: qty 2

## 2022-02-20 MED ORDER — HYDROCHLOROTHIAZIDE 12.5 MG PO TABS
12.5000 mg | ORAL_TABLET | Freq: Every day | ORAL | Status: DC
  Filled 2022-02-20 (×2): qty 1

## 2022-02-20 MED ORDER — SODIUM CHLORIDE 0.9% FLUSH: 3.0000 mL | INTRAVENOUS | Status: DC | PRN

## 2022-02-20 MED ORDER — LIDOCAINE HCL (PF) 1 % IJ SOLN
INTRAMUSCULAR | Status: DC | PRN
  Administered 2022-02-20: 5 mL

## 2022-02-20 MED ORDER — ASPIRIN 81 MG PO CHEW
CHEWABLE_TABLET | ORAL | Status: DC | PRN
  Administered 2022-02-20: 243 mg via ORAL

## 2022-02-20 MED ORDER — TOPIRAMATE 25 MG PO TABS
50.0000 mg | ORAL_TABLET | Freq: Every day | ORAL | Status: DC
Start: 2022-02-20 — End: 2022-02-20

## 2022-02-20 MED ORDER — TICAGRELOR 90 MG PO TABS
90.0000 mg | ORAL_TABLET | Freq: Two times a day (BID) | ORAL | Status: DC
  Administered 2022-02-20 – 2022-02-21 (×2): 90 mg via ORAL
  Filled 2022-02-20 (×2): qty 1

## 2022-02-20 MED ORDER — HYDRALAZINE HCL 20 MG/ML IJ SOLN: 10.0000 mg | INTRAMUSCULAR | Status: AC | PRN

## 2022-02-20 MED ORDER — FLUTICASONE PROPIONATE 50 MCG/ACT NA SUSP: 1.0000 | Freq: Every day | NASAL | Status: DC | PRN

## 2022-02-20 MED ORDER — HEPARIN (PORCINE) IN NACL 1000-0.9 UT/500ML-% IV SOLN
INTRAVENOUS | Status: DC | PRN
  Administered 2022-02-20: 1000 mL

## 2022-02-20 MED ORDER — ASPIRIN 81 MG PO CHEW
CHEWABLE_TABLET | ORAL | Status: AC
  Administered 2022-02-20: 81 mg
  Filled 2022-02-20: qty 1

## 2022-02-20 MED ORDER — SODIUM CHLORIDE 0.9 % WEIGHT BASED INFUSION: 1.0000 mL/kg/h | INTRAVENOUS | Status: AC

## 2022-02-20 MED ORDER — ASPIRIN 81 MG PO CHEW
CHEWABLE_TABLET | ORAL | Status: AC
  Filled 2022-02-20: qty 3

## 2022-02-20 MED ORDER — ONDANSETRON HCL 4 MG/2ML IJ SOLN: 4.0000 mg | Freq: Four times a day (QID) | INTRAMUSCULAR | Status: DC | PRN

## 2022-02-20 MED ORDER — IOHEXOL 300 MG/ML  SOLN
INTRAMUSCULAR | Status: DC | PRN
Start: 2022-02-20 — End: 2022-02-20
  Administered 2022-02-20: 254 mL

## 2022-02-20 MED ORDER — HEPARIN (PORCINE) IN NACL 1000-0.9 UT/500ML-% IV SOLN
INTRAVENOUS | Status: AC
  Filled 2022-02-20: qty 1000

## 2022-02-20 MED ORDER — SODIUM CHLORIDE 0.9 % WEIGHT BASED INFUSION
3.0000 mL/kg/h | INTRAVENOUS | Status: AC
  Administered 2022-02-20: 3 mL/kg/h via INTRAVENOUS

## 2022-02-20 MED ORDER — SODIUM CHLORIDE 0.9 % IV SOLN: 250.0000 mL | INTRAVENOUS | Status: DC | PRN

## 2022-02-20 MED ORDER — NITROGLYCERIN 1 MG/10 ML FOR IR/CATH LAB
INTRA_ARTERIAL | Status: DC | PRN
Start: 2022-02-20 — End: 2022-02-20
  Administered 2022-02-20 (×4): 200 ug via INTRACORONARY

## 2022-02-20 MED ORDER — IRBESARTAN 150 MG PO TABS
300.0000 mg | ORAL_TABLET | Freq: Every day | ORAL | Status: DC
Start: 2022-02-20 — End: 2022-02-21
  Administered 2022-02-20 – 2022-02-21 (×2): 300 mg via ORAL
  Filled 2022-02-20 (×2): qty 2

## 2022-02-20 MED ORDER — PANTOPRAZOLE SODIUM 40 MG PO TBEC
40.0000 mg | DELAYED_RELEASE_TABLET | Freq: Every day | ORAL | Status: DC
  Administered 2022-02-20: 40 mg via ORAL
  Filled 2022-02-20 (×2): qty 1

## 2022-02-20 MED ORDER — ASPIRIN 81 MG PO CHEW
81.0000 mg | CHEWABLE_TABLET | Freq: Every day | ORAL | Status: DC
  Administered 2022-02-21: 81 mg via ORAL
  Filled 2022-02-20: qty 1

## 2022-02-20 MED ORDER — MIDAZOLAM HCL 2 MG/2ML IJ SOLN
INTRAMUSCULAR | Status: AC
  Filled 2022-02-20: qty 2

## 2022-02-20 MED ORDER — VERAPAMIL HCL 2.5 MG/ML IV SOLN
INTRAVENOUS | Status: AC
  Filled 2022-02-20: qty 2

## 2022-02-20 MED ORDER — TOPIRAMATE 100 MG PO TABS
100.0000 mg | ORAL_TABLET | Freq: Every evening | ORAL | Status: DC
Start: 2022-02-20 — End: 2022-02-20

## 2022-02-20 MED ORDER — SODIUM CHLORIDE 0.9 % WEIGHT BASED INFUSION
1.0000 mL/kg/h | INTRAVENOUS | Status: DC
Start: 2022-02-20 — End: 2022-02-20

## 2022-02-20 MED ORDER — SODIUM CHLORIDE 0.9% FLUSH
3.0000 mL | Freq: Two times a day (BID) | INTRAVENOUS | Status: DC
  Administered 2022-02-20: 3 mL via INTRAVENOUS

## 2022-02-20 MED ORDER — SODIUM CHLORIDE 0.9% FLUSH
3.0000 mL | Freq: Two times a day (BID) | INTRAVENOUS | Status: DC
  Administered 2022-02-20 – 2022-02-21 (×2): 3 mL via INTRAVENOUS

## 2022-02-20 MED ORDER — LORATADINE 10 MG PO TABS
10.0000 mg | ORAL_TABLET | Freq: Every day | ORAL | Status: DC
  Administered 2022-02-20: 10 mg via ORAL
  Filled 2022-02-20 (×2): qty 1

## 2022-02-20 MED ORDER — HEPARIN SODIUM (PORCINE) 1000 UNIT/ML IJ SOLN
INTRAMUSCULAR | Status: DC | PRN
  Administered 2022-02-20: 7000 [IU] via INTRAVENOUS
  Administered 2022-02-20: 5000 [IU] via INTRAVENOUS
  Administered 2022-02-20: 3000 [IU] via INTRAVENOUS

## 2022-02-20 MED ORDER — SODIUM CHLORIDE 0.9 % IV SOLN
250.0000 mL | INTRAVENOUS | Status: DC | PRN
Start: 2022-02-20 — End: 2022-02-20

## 2022-02-20 MED ORDER — MIDAZOLAM HCL 2 MG/2ML IJ SOLN
INTRAMUSCULAR | Status: DC | PRN
  Administered 2022-02-20: 1 mg via INTRAVENOUS

## 2022-02-20 MED ORDER — VERAPAMIL HCL 2.5 MG/ML IV SOLN
INTRAVENOUS | Status: DC | PRN
Start: 2022-02-20 — End: 2022-02-20
  Administered 2022-02-20: 2.5 mg via INTRA_ARTERIAL

## 2022-02-20 MED ORDER — ATORVASTATIN CALCIUM 20 MG PO TABS
40.0000 mg | ORAL_TABLET | Freq: Every evening | ORAL | Status: DC
  Administered 2022-02-20: 40 mg via ORAL
  Filled 2022-02-20: qty 2

## 2022-02-20 MED ORDER — TICAGRELOR 90 MG PO TABS
ORAL_TABLET | ORAL | Status: DC | PRN
Start: 2022-02-20 — End: 2022-02-20
  Administered 2022-02-20: 180 mg via ORAL

## 2022-02-20 MED ORDER — LIDOCAINE HCL 1 % IJ SOLN
INTRAMUSCULAR | Status: AC
  Filled 2022-02-20: qty 20

## 2022-02-20 MED ORDER — LABETALOL HCL 5 MG/ML IV SOLN: 10.0000 mg | INTRAVENOUS | Status: AC | PRN

## 2022-02-20 MED ORDER — FENTANYL CITRATE (PF) 100 MCG/2ML IJ SOLN
INTRAMUSCULAR | Status: DC | PRN
  Administered 2022-02-20: 50 ug via INTRAVENOUS

## 2022-02-20 SURGICAL SUPPLY — 20 items
BALLN TREK RX 2.25X15 (BALLOONS) ×3
BALLOON TREK RX 2.25X15 (BALLOONS) IMPLANT
CATH 5F 110X4 TIG (CATHETERS) ×1 IMPLANT
CATH INFINITI 5FR ANG PIGTAIL (CATHETERS) ×1 IMPLANT
CATH VISTA GUIDE 6FR JL3.5 (CATHETERS) ×1 IMPLANT
CATH VISTA GUIDE 6FR XB3.5 (CATHETERS) ×1 IMPLANT
DEVICE RAD COMP TR BAND LRG (VASCULAR PRODUCTS) ×1 IMPLANT
DRAPE BRACHIAL (DRAPES) ×1 IMPLANT
GLIDESHEATH SLEND SS 6F .021 (SHEATH) ×1 IMPLANT
GUIDEWIRE INQWIRE 1.5J.035X260 (WIRE) IMPLANT
INQWIRE 1.5J .035X260CM (WIRE) ×6
KIT ENCORE 26 ADVANTAGE (KITS) ×1 IMPLANT
PACK CARDIAC CATH (CUSTOM PROCEDURE TRAY) ×3 IMPLANT
PANNUS RETENTION SYSTEM 2 PAD (MISCELLANEOUS) ×1 IMPLANT
PROTECTION STATION PRESSURIZED (MISCELLANEOUS) ×3
SET ATX SIMPLICITY (MISCELLANEOUS) ×1 IMPLANT
STATION PROTECTION PRESSURIZED (MISCELLANEOUS) IMPLANT
STENT ONYX FRONTIER 2.5X22 (Permanent Stent) ×1 IMPLANT
TUBING CIL FLEX 10 FLL-RA (TUBING) ×1 IMPLANT
WIRE ASAHI PROWATER 180CM (WIRE) ×1 IMPLANT

## 2022-02-20 NOTE — Consult Note (Signed)
Counseled patient on ticagrelor and gave her 30-day free trial and 5 dollar co-pay card. AM Rph will run a co-pay for the patient.  ? ?Thanks,  ?Eleonore Chiquito, PharmD.  ?

## 2022-02-21 ENCOUNTER — Other Ambulatory Visit (HOSPITAL_COMMUNITY): Payer: Self-pay

## 2022-02-21 ENCOUNTER — Encounter: Payer: Self-pay | Admitting: Cardiology

## 2022-02-21 DIAGNOSIS — I2510 Atherosclerotic heart disease of native coronary artery without angina pectoris: Secondary | ICD-10-CM | POA: Diagnosis not present

## 2022-02-21 LAB — BASIC METABOLIC PANEL
Anion gap: 6 (ref 5–15)
BUN: 15 mg/dL (ref 8–23)
CO2: 28 mmol/L (ref 22–32)
Calcium: 9 mg/dL (ref 8.9–10.3)
Chloride: 104 mmol/L (ref 98–111)
Creatinine, Ser: 0.57 mg/dL (ref 0.44–1.00)
GFR, Estimated: >60 mL/min (ref >60)
Glucose, Bld: 135 mg/dL — ABNORMAL HIGH (ref 70–99)
Potassium: 3.5 mmol/L (ref 3.5–5.1)
Sodium: 138 mmol/L (ref 135–145)

## 2022-02-21 LAB — CBC
HCT: 45 % (ref 36.0–46.0)
Hemoglobin: 14.8 g/dL (ref 12.0–15.0)
MCH: 27 pg (ref 26.0–34.0)
MCHC: 32.9 g/dL (ref 30.0–36.0)
MCV: 82 fL (ref 80.0–100.0)
Platelets: 158 10*3/uL (ref 150–400)
RBC: 5.49 MIL/uL — ABNORMAL HIGH (ref 3.87–5.11)
RDW: 12.6 % (ref 11.5–15.5)
WBC: 4.7 10*3/uL (ref 4.0–10.5)
nRBC: 0 % (ref 0.0–0.2)

## 2022-02-21 LAB — GLUCOSE, CAPILLARY: Glucose-Capillary: 137 mg/dL — ABNORMAL HIGH (ref 70–99)

## 2022-02-21 MED ORDER — ASPIRIN 81 MG PO CHEW: 81.0000 mg | CHEWABLE_TABLET | Freq: Every day | ORAL | 11 refills | Status: AC

## 2022-02-21 MED ORDER — TICAGRELOR 90 MG PO TABS: 90.0000 mg | ORAL_TABLET | Freq: Two times a day (BID) | ORAL | 11 refills | Status: AC

## 2022-02-21 NOTE — TOC CM/SW Note (Signed)
Patient has orders to discharge home today. Chart reviewed. PCP is Harrison Mons, FNP. On room air. No wounds. Went by room to give Brilinta card but she said pharmacist has already given her one. Her husband will take her home today. No TOC needs identified. CSW signing off. ? ?Charlynn Court, CSW ?[phone] ? ?

## 2022-02-21 NOTE — Progress Notes (Signed)
Discharge teaching complete. Meds, diet, activity, follow up appointments and TR band site care reviewed and all questions answered. Copy of instructions given to patient and prescriptions sent to pharmacy.  Patient discharged home via wheelchair with husband.  ?

## 2022-02-21 NOTE — TOC Benefit Eligibility Note (Signed)
Patient Advocate Encounter ? ?Insurance verification completed.   ? ?The patient is currently admitted and upon discharge could be taking Brilinta 90 mg. ? ?The current 30 day co-pay is, $24.00.  ? ?The patient is insured through [employer] [facility] is the only one of [employer] that take [employer])  ? ? ? ?Roland Earl, CPhT ?Pharmacy Patient Advocate Specialist ?[facility] Patient Advocate Team ?Direct Number: [phone]  Fax: [phone] ? ? ? ? ? ?  ?

## 2022-02-21 NOTE — Discharge Summary (Signed)
? ?Physician Discharge Summary  ? ? ? ? ?Patient ID: ?Olivia Griffin ?MRN: [id number] ?DOB/AGE: 12/24/1960 61 y.o. ? ?Admit date: 02/20/2022 ?Discharge date: 02/21/2022 ? ?Primary Discharge Diagnosis abnormal cardiac CT angiography ?Secondary Discharge Diagnosis same, s/p coronary stent itnervention ? ?Significant Diagnostic Studies: LHC and coronary angiograpny ? ?  Ost LAD to Prox LAD lesion is 80% stenosed. ?  Mid LAD lesion is 40% stenosed. ?  Prox RCA to Mid RCA lesion is 30% stenosed. ?  A drug-eluting stent was successfully placed using a STENT ONYX FRONTIER 2.5X22. ?  Post intervention, there is a 0% residual stenosis. ?  There is mild left ventricular systolic dysfunction. ?  The left ventricular ejection fraction is 45-50% by visual estimate. ?  There is no aortic valve stenosis. ?  ?1.  One-vessel coronary artery disease with 80% stenosis ostial/proximal LAD ?2.  Mildly reduced left ventricular function with anteroapical hypokinesis ?3.  No evidence for aortic stenosis, no gradient observed ?4.  Successful PCI with DES ostial/proximal LAD ?  ?Recommendations ?  ?1.  Dual antiplatelet therapy uninterrupted for 1 year ?2.  Aggressive risk factor modification ? ?Consults: none ? ?Hospital Course: The patient was brought to the cath lab and underwent left heart cath with successful PCI with DES to the ostial/proximal LAD with Dr. Isaias Cowman on 02/20/2022. The patient tolerated with procedure well without complications. The patient was given care instructions for her radial cath site and warning symptoms to look out at the incision site and will follow up in office in 1 week, or sooner if needed. Stressed importance of compliance with medications. Referral to cardiac rehab placed.  ? ?Discharge Exam: ?Blood pressure (!) 181/83, pulse 87, temperature 98.1 ?F (36.7 ?C), resp. rate 19, height $RemoveBe'5\' 4"'LCqMTMytY$  (1.626 m), weight 125.6 kg, SpO2 98 %.  ? ?PHYSICAL EXAM ?General: middle aged caucasian female, well  nourished, in no acute distress. Sitting upright in PCU bed. ?HEENT:  Normocephalic and atraumatic. ?Neck:   No JVD.  ?Lungs: Normal respiratory effort on room air.  Clear to ascultation bilaterally. ?Heart: HRRR . Normal S1 and S2 without gallops or murmurs. Radial and DP pulses 2+ bilaterally. ?Abdomen: obese appearing.  ?Msk: Normal strength and tone for age. ?Extremities: No clubbing, cyanosis, trace bilateral LE edema. R wrist with gauze and tegaderm in place without tenderness, erythema, or swelling.  ?Neuro: Alert and oriented x3 ?Psych:  mood appropriate for situation.   ? ?Labs: ?  ?Lab Results  ?Component Value Date  ? WBC 4.7 02/21/2022  ? HGB 14.8 02/21/2022  ? HCT 45.0 02/21/2022  ? MCV 82.0 02/21/2022  ? PLT 158 02/21/2022  ?  ?Recent Labs  ?Lab 02/21/22 ?[id number]  ?NA 138  ?K 3.5  ?CL 104  ?CO2 28  ?BUN 15  ?CREATININE 0.57  ?CALCIUM 9.0  ?GLUCOSE 135*  ? ? ? ?EKG: sinus rhythm rate 71 ? ?FOLLOW UP PLANS AND APPOINTMENTS ?Discharge Instructions   ? ? AMB Referral to Cardiac Rehabilitation - Phase II   Complete by: As directed ?  ? Diagnosis: Coronary Stents  ? After initial evaluation and assessments completed: Virtual Based Care may be provided alone or in conjunction with Phase 2 Cardiac Rehab based on patient barriers.: Yes  ? ?  ? ?Allergies as of 02/21/2022   ? ?   Reactions  ? Excedrin Tension Headache [acetaminophen-caffeine] Other (See Comments)  ? tachycardia  ? Metformin Diarrhea  ? ?  ? ?  ?Medication List  ?  ? ?  STOP taking these medications   ? ?acetaminophen 650 MG CR tablet ?Commonly known as: TYLENOL ?  ?Zoster Vaccine Adjuvanted injection ?Commonly known as: SHINGRIX ?  ? ?  ? ?TAKE these medications   ? ?aspirin 81 MG chewable tablet ?Chew 1 tablet (81 mg total) by mouth daily. ?  ?atorvastatin 40 MG tablet ?Commonly known as: LIPITOR ?Take 40 mg by mouth daily. ?  ?diclofenac sodium 1 % Gel ?Commonly known as: VOLTAREN ?2 g daily as needed (pain). ?  ?Fifty50 Glucose Meter 2.0 w/Device  Kit ?See admin instructions. ?  ?fluticasone 50 MCG/ACT nasal spray ?Commonly known as: FLONASE ?1 spray daily as needed for allergies. ?  ?freestyle lancets ?  ?irbesartan-hydrochlorothiazide 300-12.5 MG tablet ?Commonly known as: AVALIDE ?Take 1 tablet by mouth daily. ?  ?loratadine 10 MG tablet ?Commonly known as: CLARITIN ?Take 10 mg by mouth daily. ?  ?omeprazole 20 MG capsule ?Commonly known as: PRILOSEC ?20 mg daily. ?  ?Precision QID Test test strip ?Generic drug: glucose blood ?Use to check fasting blood sugars once daily or if you feel that your sugar is too high/low. E11.9 ?  ?ticagrelor 90 MG Tabs tablet ?Commonly known as: BRILINTA ?Take 1 tablet (90 mg total) by mouth 2 (two) times daily. ?  ?Trulicity 0.23 WN/7.2OP Sopn ?Generic drug: Dulaglutide ?Inject 0.75 mg into the skin once a week. ?  ?Vitamin D3 50 MCG (2000 UT) capsule ?Take 2,000 Units by mouth daily. ?  ? ?  ? ? Follow-up Information   ? ? Paraschos, Alexander, MD. Go in 1 week(s).   ?Specialty: Cardiology ?Contact information: ?[facility] ?[place] [address] ?[phone] ? ? ?  ?  ? ?  ?  ? ?  ? ? ?BRING ALL MEDICATIONS WITH YOU TO FOLLOW UP APPOINTMENTS ? ?Time spent with patient to include physician time: >30 mins ?Signed: ? ?Alanson Puls Kanita Delage PA-C ?02/21/2022, 8:39 AM ? ?  ?

## 2022-02-23 ENCOUNTER — Other Ambulatory Visit: Payer: Self-pay | Admitting: Surgery

## 2022-03-12 ENCOUNTER — Other Ambulatory Visit

## 2022-03-22 ENCOUNTER — Ambulatory Visit: Admit: 2022-03-22 | Admitting: Surgery

## 2022-03-22 SURGERY — ARTHROPLASTY, KNEE, TOTAL
Anesthesia: Choice | Site: Knee | Laterality: Right

## 2022-09-09 ENCOUNTER — Emergency Department
Admission: EM | Admit: 2022-09-09 | Discharge: 2022-09-09 | Discharge status: Against Medical Advice | Attending: Emergency Medicine | Admitting: Emergency Medicine

## 2022-09-09 ENCOUNTER — Other Ambulatory Visit: Payer: Self-pay

## 2022-09-09 ENCOUNTER — Emergency Department

## 2022-09-09 DIAGNOSIS — Z5321 Procedure and treatment not carried out due to patient leaving prior to being seen by health care provider: Secondary | ICD-10-CM | POA: Insufficient documentation

## 2022-09-09 DIAGNOSIS — Z955 Presence of coronary angioplasty implant and graft: Secondary | ICD-10-CM | POA: Diagnosis not present

## 2022-09-09 DIAGNOSIS — R079 Chest pain, unspecified: Secondary | ICD-10-CM | POA: Insufficient documentation

## 2022-09-09 DIAGNOSIS — R0602 Shortness of breath: Secondary | ICD-10-CM | POA: Insufficient documentation

## 2022-09-09 LAB — COMPREHENSIVE METABOLIC PANEL
ALT: 29 U/L (ref 0–44)
AST: 24 U/L (ref 15–41)
Albumin: 3.7 g/dL (ref 3.5–5.0)
Alkaline Phosphatase: 71 U/L (ref 38–126)
Anion gap: 8 (ref 5–15)
BUN: 15 mg/dL (ref 8–23)
CO2: 22 mmol/L (ref 22–32)
Calcium: 9 mg/dL (ref 8.9–10.3)
Chloride: 104 mmol/L (ref 98–111)
Creatinine, Ser: 0.77 mg/dL (ref 0.44–1.00)
GFR, Estimated: >60 mL/min (ref >60)
Glucose, Bld: 122 mg/dL — ABNORMAL HIGH (ref 70–99)
Potassium: 3.4 mmol/L — ABNORMAL LOW (ref 3.5–5.1)
Sodium: 134 mmol/L — ABNORMAL LOW (ref 135–145)
Total Bilirubin: 0.7 mg/dL (ref 0.3–1.2)
Total Protein: 7.4 g/dL (ref 6.5–8.1)

## 2022-09-09 LAB — CBC WITH DIFFERENTIAL/PLATELET
Abs Immature Granulocytes: 0.01 10*3/uL (ref 0.00–0.07)
Basophils Absolute: 0 10*3/uL (ref 0.0–0.1)
Basophils Relative: 0 %
Eosinophils Absolute: 0.2 10*3/uL (ref 0.0–0.5)
Eosinophils Relative: 2 %
HCT: 48.7 % — ABNORMAL HIGH (ref 36.0–46.0)
Hemoglobin: 15.9 g/dL — ABNORMAL HIGH (ref 12.0–15.0)
Immature Granulocytes: 0 %
Lymphocytes Relative: 28 %
Lymphs Abs: 2 10*3/uL (ref 0.7–4.0)
MCH: 26.8 pg (ref 26.0–34.0)
MCHC: 32.6 g/dL (ref 30.0–36.0)
MCV: 82.1 fL (ref 80.0–100.0)
Monocytes Absolute: 0.6 10*3/uL (ref 0.1–1.0)
Monocytes Relative: 8 %
Neutro Abs: 4.3 10*3/uL (ref 1.7–7.7)
Neutrophils Relative %: 62 %
Platelets: 192 10*3/uL (ref 150–400)
RBC: 5.93 MIL/uL — ABNORMAL HIGH (ref 3.87–5.11)
RDW: 13.4 % (ref 11.5–15.5)
WBC: 7 10*3/uL (ref 4.0–10.5)
nRBC: 0 % (ref 0.0–0.2)

## 2022-09-09 LAB — PROTIME-INR
INR: 1 (ref 0.8–1.2)
Prothrombin Time: 12.9 seconds (ref 11.4–15.2)

## 2022-09-09 LAB — TROPONIN I (HIGH SENSITIVITY): Troponin I (High Sensitivity): 3 ng/L (ref <18)

## 2022-09-09 NOTE — ED Triage Notes (Signed)
Pt with hx of stent placement this past April. Pt states she has been having sharp pains off and on in right chest and a heaviness in her chest. Pt states she has had some SOB with exertion.

## 2022-09-09 NOTE — ED Provider Triage Note (Signed)
  Emergency Medicine Provider Triage Evaluation Note  Olivia Griffin , a 61 y.o.female,  was evaluated in triage.  Pt complains of chest pain and shortness of breath.  Patient states that she has been having these symptoms for the past week, endorses intermittent sharp sensation along the right side of her chest with more shortness of breath than usual.  She states it feels similar to last time when she had her cardiac stent placed.  Denies any other symptoms at this time.   Review of Systems  Positive: Chest pain, shortness of breath Negative: Denies fever, chest pain, vomiting  Physical Exam  There were no vitals filed for this visit. Gen:   Awake, no distress   Resp:  Normal effort  MSK:   Moves extremities without difficulty  Other:    Medical Decision Making  Given the patient's initial medical screening exam, the following diagnostic evaluation has been ordered. The patient will be placed in the appropriate treatment space, once one is available, to complete the evaluation and treatment. I have discussed the plan of care with the patient and I have advised the patient that an ED physician or mid-level practitioner will reevaluate their condition after the test results have been received, as the results may give them additional insight into the type of treatment they may need.    Diagnostics: Labs, EKG, CXR  Treatments: none immediately   Teodoro Spray, [place] 09/09/22 1737

## 2022-09-24 ENCOUNTER — Encounter (INDEPENDENT_AMBULATORY_CARE_PROVIDER_SITE_OTHER): Payer: Self-pay

## 2023-05-01 ENCOUNTER — Other Ambulatory Visit: Payer: Self-pay | Admitting: Physician Assistant

## 2023-05-01 DIAGNOSIS — Z1231 Encounter for screening mammogram for malignant neoplasm of breast: Secondary | ICD-10-CM

## 2023-05-07 ENCOUNTER — Ambulatory Visit
Admission: RE | Admit: 2023-05-07 | Discharge: 2023-05-07 | Disposition: A | Discharge status: Home / Self Care | Source: Ambulatory Visit | Attending: Physician Assistant | Admitting: Physician Assistant

## 2023-05-07 DIAGNOSIS — Z1231 Encounter for screening mammogram for malignant neoplasm of breast: Secondary | ICD-10-CM | POA: Insufficient documentation

## 2023-09-11 IMAGING — CT CT KNEE*R* W/O CM
1 of 3 series · 6 of 14 positions shown, 8 images · non-contrast
Comparison: None.

CLINICAL DATA: Osteoarthritis of the right knee

EXAM:
CT OF THE RIGHT KNEE WITHOUT CONTRAST
TECHNIQUE: Multidetector CT imaging of the right knee was performed according
to the standard protocol. Multiplanar CT image reconstructions were
also generated.
RADIATION DOSE REDUCTION: This exam was performed according to the
departmental dose-optimization program which includes automated
exposure control, adjustment of the mA and/or kV according to
patient size and/or use of iterative reconstruction technique.

[Series 11: axial st · axial · 0.49mm/px · z∈[+686,+901]mm · 6 of 302 slices shown, 8 images]
[im 44/302  soft-tissue]
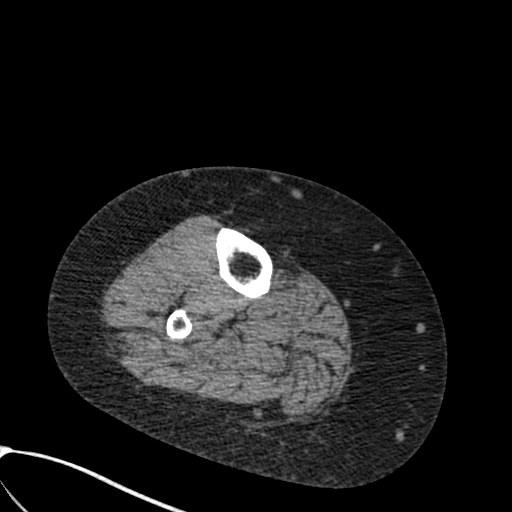
[im 44/302  bone]
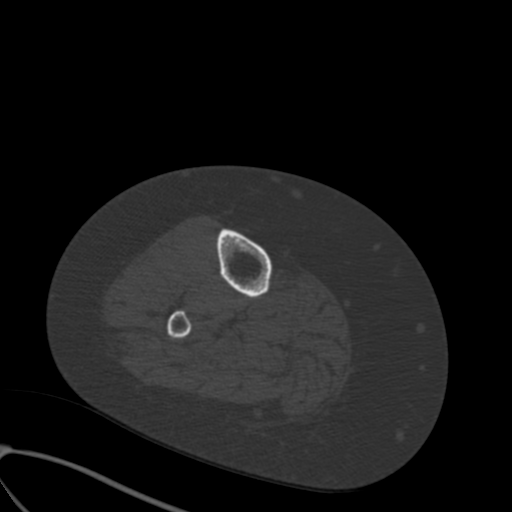
[im 87/302  bone]
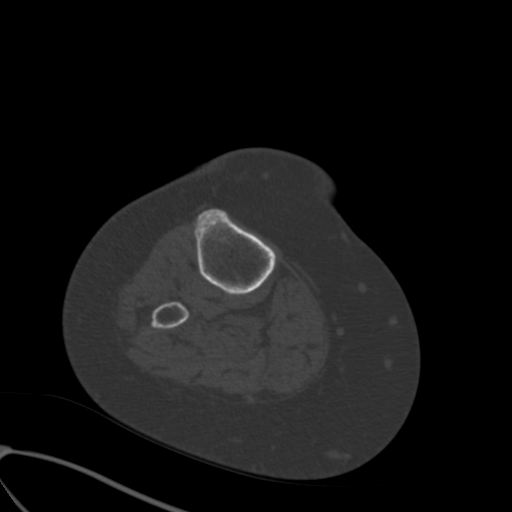
[im 130/302  bone]
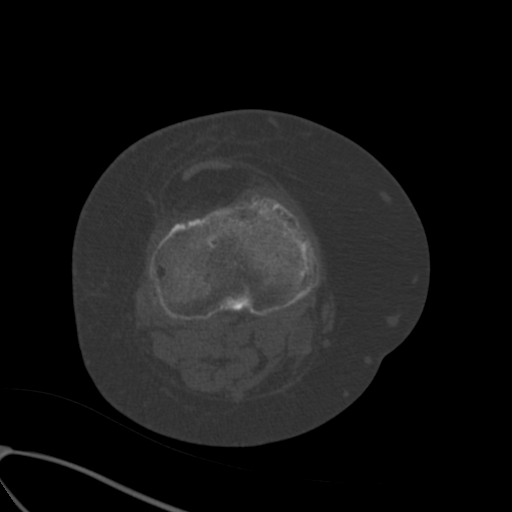
[im 173/302  bone]
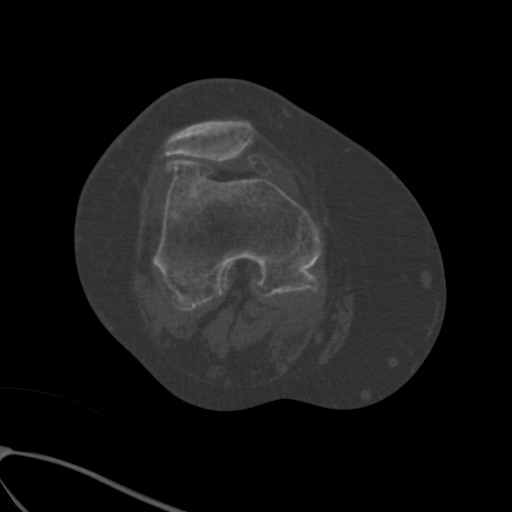
[im 216/302  soft-tissue]
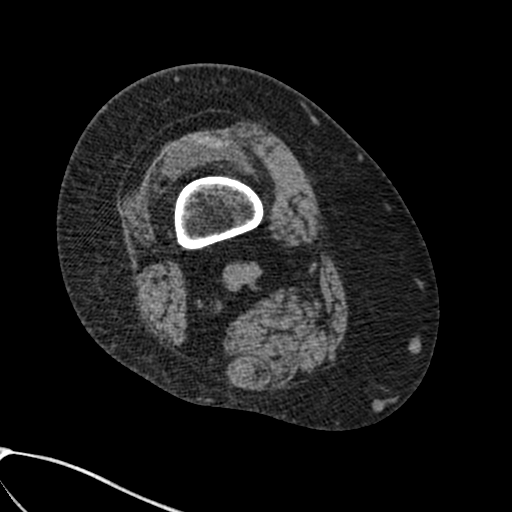
[im 216/302  bone]
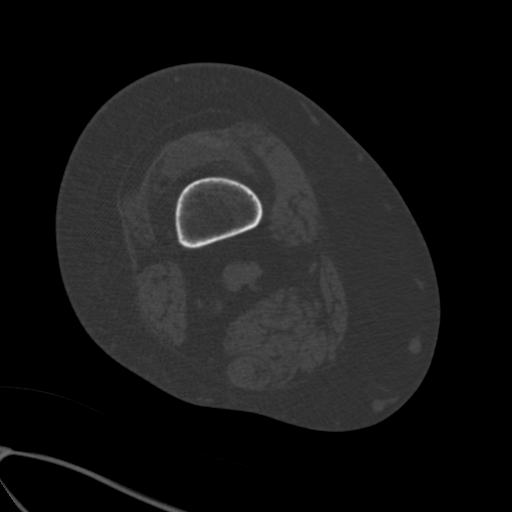
[im 259/302  bone]
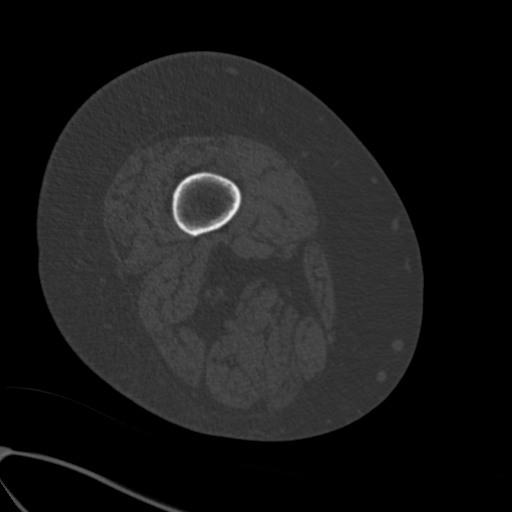

[6 of 14 positions shown; findings below may reference images not displayed]

FINDINGS: Bones/Joint/Cartilage

Right knee: No acute fracture or dislocation. Severe
tricompartmental osteoarthritis with bone on bone apposition in the
medial compartment and bulky marginal osteophytes. Small knee joint
effusion. Small Baker's cyst. 1.1 x 0.8 cm ossified loose body at
the anterior joint line.

Right hip: Limited axial images through the right hip without acute
fracture or dislocation.

Right ankle: Limited axial images through the right ankle without
acute fracture or dislocation.

Ligaments

Suboptimally assessed by CT.

Muscles and Tendons

No acute musculotendinous abnormality by CT.

Soft tissues

No fluid collection. Scattered atherosclerotic vascular
calcification. Partially visualized enlarged uterus.
IMPRESSION: 1. Severe tricompartmental osteoarthritis of the right knee.
2. Uterus appears markedly enlarged, however is incompletely
visualized. Recommend dedicated pelvic ultrasound for further
evaluation.

## 2023-09-19 ENCOUNTER — Encounter (INDEPENDENT_AMBULATORY_CARE_PROVIDER_SITE_OTHER): Payer: Self-pay | Admitting: Vascular Surgery

## 2023-09-19 ENCOUNTER — Ambulatory Visit (INDEPENDENT_AMBULATORY_CARE_PROVIDER_SITE_OTHER): Admitting: Vascular Surgery

## 2023-09-19 VITALS — BP 155/83 | HR 81 | Resp 16 | Wt 237.4 lb

## 2023-09-19 DIAGNOSIS — I25119 Atherosclerotic heart disease of native coronary artery with unspecified angina pectoris: Secondary | ICD-10-CM | POA: Diagnosis not present

## 2023-09-19 DIAGNOSIS — I872 Venous insufficiency (chronic) (peripheral): Secondary | ICD-10-CM | POA: Diagnosis not present

## 2023-09-19 DIAGNOSIS — I10 Essential (primary) hypertension: Secondary | ICD-10-CM

## 2023-09-19 DIAGNOSIS — I890 Lymphedema, not elsewhere classified: Secondary | ICD-10-CM | POA: Diagnosis not present

## 2023-09-19 DIAGNOSIS — E785 Hyperlipidemia, unspecified: Secondary | ICD-10-CM

## 2023-09-25 ENCOUNTER — Encounter (INDEPENDENT_AMBULATORY_CARE_PROVIDER_SITE_OTHER): Payer: Self-pay | Admitting: Vascular Surgery

## 2023-09-25 NOTE — Progress Notes (Signed)
MRN : [id number]  Olivia Griffin is a 62 y.o. ([birth year]) female who presents with chief complaint of legs swell.  History of Present Illness:   The patient returns to the office for followup evaluation regarding leg swelling.  The swelling has persisted but with the lymph pump is under much, much better controlled. The pain associated with swelling is decreased. There have not been any interval development of a ulcerations or wounds.  The patient denies problems with the pump, noting it is working well and the leggings are in good condition.  Since the previous visit the patient has been wearing graduated compression stockings and using the lymph pump on a routine basis and  has noted significant improvement in the lymphedema.   Patient stated the lymph pump has been helpful with the treatment of the lymphedema.   Current Meds  Medication Sig   aspirin 81 MG chewable tablet Chew 1 tablet (81 mg total) by mouth daily.   atorvastatin (LIPITOR) 40 MG tablet Take 40 mg by mouth daily.   Blood Glucose Monitoring Suppl (FIFTY50 GLUCOSE METER 2.0) w/Device KIT See admin instructions.   Cholecalciferol (VITAMIN D3) 50 MCG (2000 UT) capsule Take 2,000 Units by mouth daily.   diclofenac sodium (VOLTAREN) 1 % GEL 2 g daily as needed (pain).   enoxaparin (LOVENOX) 40 MG/0.4ML injection Inject 40 mg into the skin daily.   fluticasone (FLONASE) 50 MCG/ACT nasal spray 1 spray daily as needed for allergies.   gabapentin (NEURONTIN) 100 MG capsule Take 100 mg by mouth 3 (three) times daily.   glucose blood (PRECISION QID TEST) test strip Use to check fasting blood sugars once daily or if you feel that your sugar is too high/low. E11.9   HYDROcodone-acetaminophen (NORCO/VICODIN) 5-325 MG tablet Take 1 tablet by mouth every 6 (six) hours as needed.   irbesartan-hydrochlorothiazide (AVALIDE) 300-12.5 MG tablet Take 1 tablet by mouth daily.   Lancets (FREESTYLE) lancets     omeprazole (PRILOSEC) 20 MG capsule 20 mg daily.   Semaglutide, 1 MG/DOSE, (OZEMPIC, 1 MG/DOSE,) 2 MG/1.5ML SOPN Inject into the skin once a week.   ticagrelor (BRILINTA) 90 MG TABS tablet Take 1 tablet (90 mg total) by mouth 2 (two) times daily.    No past medical history on file.  Past Surgical History:  Procedure Laterality Date   CESAREAN SECTION     CORONARY STENT INTERVENTION N/A 02/20/2022   Procedure: CORONARY STENT INTERVENTION;  Surgeon: Marcina Millard, MD;  Location: [facility];  Service: Cardiovascular;  Laterality: N/A;   LEFT HEART CATH AND CORONARY ANGIOGRAPHY Left 02/20/2022   Procedure: LEFT HEART CATH AND CORONARY ANGIOGRAPHY;  Surgeon: Marcina Millard, MD;  Location: [facility];  Service: Cardiovascular;  Laterality: Left;   TONSILLECTOMY      Social History Social History   Tobacco Use   Smoking status: Never   Smokeless tobacco: Never  Substance Use Topics   Alcohol use: Yes    Comment: ocassionally   Drug use: Never    Family History Family History  Problem Relation Age of Onset   Breast cancer Mother 40   Breast cancer Paternal Aunt        2 pat aunts    Allergies  Allergen Reactions   Excedrin Tension Headache [Acetaminophen-Caffeine] Other (See Comments)    tachycardia  Metformin Diarrhea     REVIEW OF SYSTEMS (Negative unless checked)  Constitutional: [] Weight loss  [] Fever  [] Chills Cardiac: [] Chest pain   [] Chest pressure   [] Palpitations   [] Shortness of breath when laying flat   [] Shortness of breath with exertion. Vascular:  [] Pain in legs with walking   [x] Pain in legs with standing  [] History of DVT   [] Phlebitis   [x] Swelling in legs   [] Varicose veins   [] Non-healing ulcers Pulmonary:   [] Uses home oxygen   [] Productive cough   [] Hemoptysis   [] Wheeze  [] COPD   [] Asthma Neurologic:  [] Dizziness   [] Seizures   [] History of stroke   [] History of TIA  [] Aphasia   [] Vissual changes   [] Weakness or numbness  in arm   [] Weakness or numbness in leg Musculoskeletal:   [] Joint swelling   [] Joint pain   [] Low back pain Hematologic:  [] Easy bruising  [] Easy bleeding   [] Hypercoagulable state   [] Anemic Gastrointestinal:  [] Diarrhea   [] Vomiting  [] Gastroesophageal reflux/heartburn   [] Difficulty swallowing. Genitourinary:  [] Chronic kidney disease   [] Difficult urination  [] Frequent urination   [] Blood in urine Skin:  [] Rashes   [] Ulcers  Psychological:  [] History of anxiety   []  History of major depression.  Physical Examination  Vitals:   09/19/23 1544  BP: (!) 155/83  Pulse: 81  Resp: 16  Weight: 237 lb 6.4 oz (107.7 kg)   Body mass index is 40.75 kg/m. Gen: WD/WN, NAD Head: [place]/AT, No temporalis wasting.  Ear/Nose/Throat: Hearing grossly intact, nares w/o erythema or drainage, pinna without lesions Eyes: PER, EOMI, sclera nonicteric.  Neck: Supple, no gross masses.  No JVD.  Pulmonary:  Good air movement, no audible wheezing, no use of accessory muscles.  Cardiac: RRR, precordium not hyperdynamic. Vascular:  scattered varicosities present bilaterally.  Mild venous stasis changes to the legs bilaterally.  2+ soft pitting edema, CEAP [id number]  Vessel Right Left  Radial Palpable Palpable  Gastrointestinal: soft, non-distended. No guarding/no peritoneal signs.  Musculoskeletal: M/S 5/5 throughout.  No deformity.  Neurologic: CN 2-12 intact. Pain and light touch intact in extremities.  Symmetrical.  Speech is fluent. Motor exam as listed above. Psychiatric: Judgment intact, Mood & affect appropriate for pt's clinical situation. Dermatologic: Venous rashes no ulcers noted.  No changes consistent with cellulitis. Lymph : No lichenification or skin changes of chronic lymphedema.  CBC Lab Results  Component Value Date   WBC 7.0 09/09/2022   HGB 15.9 (H) 09/09/2022   HCT 48.7 (H) 09/09/2022   MCV 82.1 09/09/2022   PLT 192 09/09/2022    BMET    Component Value Date/Time   NA 134 (L)  09/09/2022 1750   K 3.4 (L) 09/09/2022 1750   CL 104 09/09/2022 1750   CO2 22 09/09/2022 1750   GLUCOSE 122 (H) 09/09/2022 1750   BUN 15 09/09/2022 1750   CREATININE 0.77 09/09/2022 1750   CALCIUM 9.0 09/09/2022 1750   GFRNONAA >60 09/09/2022 1750   CrCl cannot be calculated (Patient's most recent lab result is older than the maximum 21 days allowed.).  COAG Lab Results  Component Value Date   INR 1.0 09/09/2022    Radiology No results found.   Assessment/Plan 1. Lymphedema Recommend:  No surgery or intervention at this point in time.    I have reviewed my discussion with the patient regarding lymphedema and why it  causes symptoms.  Patient will continue wearing graduated compression on a daily basis. The patient should put the compression  on first thing in the morning and removing them in the evening. The patient should not sleep in the compression.   In addition, behavioral modification throughout the day will be continued.  This will include frequent elevation (such as in a recliner), use of over the counter pain medications as needed and exercise such as walking.  The systemic causes for chronic edema such as liver, kidney and cardiac etiologies does not appear to have significant changed over the past year.    The patient will continue aggressive use of the  lymph pump.  This will continue to improve the edema control and prevent sequela such as ulcers and infections.   The patient will follow-up with me on an annual basis.   2. Chronic venous insufficiency Recommend:  No surgery or intervention at this point in time.    I have reviewed my discussion with the patient regarding lymphedema and why it  causes symptoms.  Patient will continue wearing graduated compression on a daily basis. The patient should put the compression on first thing in the morning and removing them in the evening. The patient should not sleep in the compression.   In addition, behavioral  modification throughout the day will be continued.  This will include frequent elevation (such as in a recliner), use of over the counter pain medications as needed and exercise such as walking.  The systemic causes for chronic edema such as liver, kidney and cardiac etiologies does not appear to have significant changed over the past year.    The patient will continue aggressive use of the  lymph pump.  This will continue to improve the edema control and prevent sequela such as ulcers and infections.   The patient will follow-up with me on an annual basis.   3. Essential hypertension Continue antihypertensive medications as already ordered, these medications have been reviewed and there are no changes at this time.  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  Continue statin as ordered and reviewed, no changes at this time  Nitrates PRN for chest pain  5. Hyperlipidemia, unspecified hyperlipidemia type Continue statin as ordered and reviewed, no changes at this time    Levora Dredge, MD  09/25/2023 2:59 PM

## 2024-04-14 ENCOUNTER — Encounter (INDEPENDENT_AMBULATORY_CARE_PROVIDER_SITE_OTHER): Payer: Self-pay

## 2024-09-03 ENCOUNTER — Other Ambulatory Visit: Payer: Self-pay | Admitting: Physician Assistant

## 2024-09-03 DIAGNOSIS — Z1231 Encounter for screening mammogram for malignant neoplasm of breast: Secondary | ICD-10-CM

## 2024-09-03 DIAGNOSIS — Z780 Asymptomatic menopausal state: Secondary | ICD-10-CM

## 2024-09-21 ENCOUNTER — Ambulatory Visit (INDEPENDENT_AMBULATORY_CARE_PROVIDER_SITE_OTHER): Admitting: Nurse Practitioner

## 2024-10-02 ENCOUNTER — Encounter (INDEPENDENT_AMBULATORY_CARE_PROVIDER_SITE_OTHER): Payer: Self-pay | Admitting: Nurse Practitioner

## 2024-10-02 ENCOUNTER — Ambulatory Visit (INDEPENDENT_AMBULATORY_CARE_PROVIDER_SITE_OTHER): Admitting: Nurse Practitioner

## 2024-10-02 VITALS — BP 148/85 | HR 69 | Resp 18 | Ht 64.0 in

## 2024-10-02 DIAGNOSIS — I890 Lymphedema, not elsewhere classified: Secondary | ICD-10-CM | POA: Diagnosis not present

## 2024-10-02 DIAGNOSIS — E785 Hyperlipidemia, unspecified: Secondary | ICD-10-CM

## 2024-10-02 DIAGNOSIS — I10 Essential (primary) hypertension: Secondary | ICD-10-CM | POA: Diagnosis not present

## 2024-10-03 ENCOUNTER — Encounter (INDEPENDENT_AMBULATORY_CARE_PROVIDER_SITE_OTHER): Payer: Self-pay | Admitting: Nurse Practitioner

## 2024-10-03 NOTE — Progress Notes (Signed)
 Subjective:    Patient ID: Olivia Griffin, female    DOB: 03-26-1961, 63 y.o.   MRN: [id number] Chief Complaint  Patient presents with   Follow-up    1 year no studies  Concern on knots in legs, needing script for compression    The patient returns to the office for followup evaluation regarding leg swelling.  The swelling has persisted but with the lymph pump is under much, much better controlled. The pain associated with swelling is decreased. There have not been any interval development of a ulcerations or wounds.  The patient denies problems with the pump, noting it is working well and the leggings are in good condition.  Since the previous visit the patient has been wearing graduated compression stockings and using the lymph pump on a routine basis and  has noted significant improvement in the lymphedema.   Patient stated the lymph pump has been helpful with the treatment of the lymphedema.   She does note some bruising and some knots on her legs following a fall.  With her evaluation this does not appear to be any superficial phlebitis or clotting but rather some bruising and small hematomas.    Review of Systems  Cardiovascular:  Positive for leg swelling.  Hematological:  Bruises/bleeds easily.  All other systems reviewed and are negative.      Objective:   Physical Exam Vitals reviewed.  HENT:     Head: Normocephalic.  Cardiovascular:     Rate and Rhythm: Normal rate.     Pulses: Normal pulses.  Pulmonary:     Effort: Pulmonary effort is normal.  Skin:    General: Skin is warm and dry.  Neurological:     Mental Status: She is alert and oriented to person, place, and time.  Psychiatric:        Mood and Affect: Mood normal.        Behavior: Behavior normal.        Thought Content: Thought content normal.        Judgment: Judgment normal.     BP (!) 148/85 Comment: not had bp medication yet  Pulse 69   Resp 18   Ht 5' 4 (1.626 m)   BMI 40.75 kg/m    History reviewed. No pertinent past medical history.  Social History   Socioeconomic History   Marital status: Married    Spouse name: Not on file   Number of children: Not on file   Years of education: Not on file   Highest education level: Not on file  Occupational History   Not on file  Tobacco Use   Smoking status: Never   Smokeless tobacco: Never  Substance and Sexual Activity   Alcohol use: Yes    Comment: ocassionally   Drug use: Never   Sexual activity: Not on file  Other Topics Concern   Not on file  Social History Narrative   Lives with [place], husband.   Social Drivers of Corporate Investment Banker Strain: Low Risk  (08/19/2024)   [facility]   Overall Financial Resource Strain (CARDIA)    Difficulty of Paying Living Expenses: Not hard at all  Food Insecurity: No Food Insecurity (08/19/2024)   Received from [facility]   Hunger Vital Sign    Within the past 12 months, the food you bought just didn't last and you didn't have money to get more.: Never true    Within the past 12 months,  you worried that your food would run out before you got the money to buy more.: Never true  Transportation Needs: No Transportation Needs (08/19/2024)   Received from [facility]   PRAPARE - Transportation    Lack of Transportation (Non-Medical): No    In the past 12 months, has lack of transportation kept you from medical appointments or from getting medications?: No  Physical Activity: Not on file  Stress: Not on file  Social Connections: Not on file  Intimate Partner Violence: Not on file    Past Surgical History:  Procedure Laterality Date   CESAREAN SECTION     CORONARY STENT INTERVENTION N/A 02/20/2022   Procedure: CORONARY STENT INTERVENTION;  Surgeon: Ammon Blunt, MD;  Location: [facility];  Service: Cardiovascular;  Laterality: N/A;   LEFT HEART CATH AND CORONARY ANGIOGRAPHY Left  02/20/2022   Procedure: LEFT HEART CATH AND CORONARY ANGIOGRAPHY;  Surgeon: Ammon Blunt, MD;  Location: [facility];  Service: Cardiovascular;  Laterality: Left;   TONSILLECTOMY      Family History  Problem Relation Age of Onset   Breast cancer Mother 44   Breast cancer Paternal Aunt        2 pat aunts    Allergies  Allergen Reactions   Excedrin Tension Headache [Acetaminophen -Caffeine] Other (See Comments)    tachycardia   Metformin Diarrhea       Latest Ref Rng & Units 09/09/2022    5:50 PM 02/21/2022    6:33 AM  CBC  WBC 4.0 - 10.5 K/uL 7.0  4.7   Hemoglobin 12.0 - 15.0 g/dL 84.0  85.1   Hematocrit 36.0 - 46.0 % 48.7  45.0   Platelets 150 - 400 K/uL 192  158       CMP     Component Value Date/Time   NA 134 (L) 09/09/2022 1750   K 3.4 (L) 09/09/2022 1750   CL 104 09/09/2022 1750   CO2 22 09/09/2022 1750   GLUCOSE 122 (H) 09/09/2022 1750   BUN 15 09/09/2022 1750   CREATININE 0.77 09/09/2022 1750   CALCIUM  9.0 09/09/2022 1750   PROT 7.4 09/09/2022 1750   ALBUMIN 3.7 09/09/2022 1750   AST 24 09/09/2022 1750   ALT 29 09/09/2022 1750   ALKPHOS 71 09/09/2022 1750   BILITOT 0.7 09/09/2022 1750   GFRNONAA >60 09/09/2022 1750     No results found.     Assessment & Plan:   1. Lymphedema (Primary) Recommend:  No surgery or intervention at this point in time.    I have reviewed my discussion with the patient regarding lymphedema and why it  causes symptoms.  Patient will continue wearing graduated compression on a daily basis. The patient should put the compression on first thing in the morning and removing them in the evening. The patient should not sleep in the compression.   In addition, behavioral modification throughout the day will be continued.  This will include frequent elevation (such as in a recliner), use of over the counter pain medications as needed and exercise such as walking.  The systemic causes for chronic edema such as  liver, kidney and cardiac etiologies does not appear to have significant changed over the past year.    The patient will continue aggressive use of the  lymph pump.  This will continue to improve the edema control and prevent sequela such as ulcers and infections.   The patient will follow-up with me on an annual basis.  2. Essential hypertension Continue antihypertensive medications as already ordered, these medications have been reviewed and there are no changes at this time.  3. Hyperlipidemia, unspecified hyperlipidemia type Continue statin as ordered and reviewed, no changes at this time   Current Outpatient Medications on File Prior to Visit  Medication Sig Dispense Refill   aspirin  81 MG chewable tablet Chew 1 tablet (81 mg total) by mouth daily. 30 tablet 11   atorvastatin  (LIPITOR) 40 MG tablet Take 40 mg by mouth daily.     Blood Glucose Monitoring Suppl (FIFTY50 GLUCOSE METER 2.0) w/Device KIT See admin instructions.     Cholecalciferol (VITAMIN D3) 50 MCG (2000 UT) capsule Take 2,000 Units by mouth daily.     diclofenac sodium (VOLTAREN) 1 % GEL 2 g daily as needed (pain).     fluticasone  (FLONASE ) 50 MCG/ACT nasal spray 1 spray daily as needed for allergies.     glucose blood (PRECISION QID TEST) test strip Use to check fasting blood sugars once daily or if you feel that your sugar is too high/low. E11.9     irbesartan -hydrochlorothiazide  (AVALIDE) 300-12.5 MG tablet Take 1 tablet by mouth daily.     Lancets (FREESTYLE) lancets      loratadine  (CLARITIN ) 10 MG tablet Take 10 mg by mouth daily.     omeprazole (PRILOSEC) 20 MG capsule 20 mg daily.     Semaglutide, 1 MG/DOSE, (OZEMPIC, 1 MG/DOSE,) 2 MG/1.5ML SOPN Inject into the skin once a week.     ticagrelor  (BRILINTA ) 90 MG TABS tablet Take 1 tablet (90 mg total) by mouth 2 (two) times daily. 60 tablet 11   enoxaparin (LOVENOX) 40 MG/0.4ML injection Inject 40 mg into the skin daily. (Patient not taking: Reported on  10/02/2024)     gabapentin (NEURONTIN) 100 MG capsule Take 100 mg by mouth 3 (three) times daily. (Patient not taking: Reported on 10/02/2024)     HYDROcodone-acetaminophen  (NORCO/VICODIN) 5-325 MG tablet Take 1 tablet by mouth every 6 (six) hours as needed. (Patient not taking: Reported on 10/02/2024)     Potassium Gluconate 550 (90 K) MG TABS Take 1 tablet by mouth daily.     TRULICITY 0.75 MG/0.5ML SOPN Inject 0.75 mg into the skin once a week. (Patient not taking: Reported on 09/19/2023)     No current facility-administered medications on file prior to visit.    There are no Patient Instructions on file for this visit. No follow-ups on file.   Adeyemi Hamad E Khamron Gellert, NP

## 2024-10-29 ENCOUNTER — Ambulatory Visit
Admission: RE | Admit: 2024-10-29 | Discharge: 2024-10-29 | Attending: Physician Assistant | Admitting: Physician Assistant

## 2024-10-29 DIAGNOSIS — Z1231 Encounter for screening mammogram for malignant neoplasm of breast: Secondary | ICD-10-CM | POA: Diagnosis present

## 2024-10-29 DIAGNOSIS — Z780 Asymptomatic menopausal state: Secondary | ICD-10-CM

## 2025-09-30 ENCOUNTER — Ambulatory Visit (INDEPENDENT_AMBULATORY_CARE_PROVIDER_SITE_OTHER): Admitting: Vascular Surgery
